# Patient Record
Sex: Male | Born: 1975 | Race: White | Hispanic: No | Marital: Married | Smoking: Never smoker
Health system: Southern US, Community
[De-identification: ages and names within clinical notes are randomized; demographics above are authoritative.]

## PROBLEM LIST (undated history)

## (undated) DIAGNOSIS — R112 Nausea with vomiting, unspecified: Secondary | ICD-10-CM

## (undated) DIAGNOSIS — R319 Hematuria, unspecified: Secondary | ICD-10-CM

## (undated) DIAGNOSIS — I10 Essential (primary) hypertension: Secondary | ICD-10-CM

## (undated) DIAGNOSIS — I517 Cardiomegaly: Secondary | ICD-10-CM

## (undated) DIAGNOSIS — J189 Pneumonia, unspecified organism: Secondary | ICD-10-CM

## (undated) DIAGNOSIS — B009 Herpesviral infection, unspecified: Secondary | ICD-10-CM

## (undated) DIAGNOSIS — Z9889 Other specified postprocedural states: Secondary | ICD-10-CM

## (undated) HISTORY — DX: Cardiomegaly: I51.7

## (undated) HISTORY — DX: Herpesviral infection, unspecified: B00.9

## (undated) HISTORY — DX: Hematuria, unspecified: R31.9

## (undated) HISTORY — PX: WISDOM TOOTH EXTRACTION: SHX21

## (undated) HISTORY — PX: KNEE ARTHROSCOPY: SUR90

---

## 1994-07-23 HISTORY — PX: ANTERIOR CRUCIATE LIGAMENT REPAIR: SHX115

## 1997-10-30 ENCOUNTER — Emergency Department (HOSPITAL_COMMUNITY): Admission: EM | Admit: 1997-10-30 | Discharge: 1997-10-30 | Payer: Self-pay | Admitting: Emergency Medicine

## 1999-01-21 ENCOUNTER — Ambulatory Visit: Admission: RE | Admit: 1999-01-21 | Discharge: 1999-01-21 | Payer: Self-pay | Admitting: General Surgery

## 2013-05-30 ENCOUNTER — Encounter: Payer: Self-pay | Admitting: Family Medicine

## 2013-06-22 ENCOUNTER — Encounter: Payer: Self-pay | Admitting: Physician Assistant

## 2013-06-22 ENCOUNTER — Ambulatory Visit (INDEPENDENT_AMBULATORY_CARE_PROVIDER_SITE_OTHER): Payer: 59 | Admitting: Physician Assistant

## 2013-06-22 VITALS — BP 130/86 | HR 60 | Temp 97.8°F | Resp 18 | Ht 69.0 in | Wt 188.0 lb

## 2013-06-22 DIAGNOSIS — Z8249 Family history of ischemic heart disease and other diseases of the circulatory system: Secondary | ICD-10-CM | POA: Insufficient documentation

## 2013-06-22 DIAGNOSIS — Z0000 Encounter for general adult medical examination without abnormal findings: Secondary | ICD-10-CM

## 2013-06-22 DIAGNOSIS — B009 Herpesviral infection, unspecified: Secondary | ICD-10-CM

## 2013-06-22 LAB — CBC WITH DIFFERENTIAL/PLATELET
BASOS ABS: 0 10*3/uL (ref 0.0–0.1)
Basophils Relative: 0 % (ref 0–1)
Eosinophils Absolute: 0.2 10*3/uL (ref 0.0–0.7)
Eosinophils Relative: 4 % (ref 0–5)
HCT: 42.2 % (ref 39.0–52.0)
Hemoglobin: 15.1 g/dL (ref 13.0–17.0)
LYMPHS ABS: 2.4 10*3/uL (ref 0.7–4.0)
Lymphocytes Relative: 39 % (ref 12–46)
MCH: 31 pg (ref 26.0–34.0)
MCHC: 35.8 g/dL (ref 30.0–36.0)
MCV: 86.7 fL (ref 78.0–100.0)
Monocytes Absolute: 0.7 10*3/uL (ref 0.1–1.0)
Monocytes Relative: 11 % (ref 3–12)
NEUTROS ABS: 2.8 10*3/uL (ref 1.7–7.7)
Neutrophils Relative %: 46 % (ref 43–77)
PLATELETS: 247 10*3/uL (ref 150–400)
RBC: 4.87 MIL/uL (ref 4.22–5.81)
RDW: 13.1 % (ref 11.5–15.5)
WBC: 6.1 10*3/uL (ref 4.0–10.5)

## 2013-06-22 LAB — COMPLETE METABOLIC PANEL WITH GFR
ALBUMIN: 4.8 g/dL (ref 3.5–5.2)
ALT: 16 U/L (ref 0–53)
AST: 19 U/L (ref 0–37)
Alkaline Phosphatase: 39 U/L (ref 39–117)
BUN: 16 mg/dL (ref 6–23)
CALCIUM: 9.5 mg/dL (ref 8.4–10.5)
CHLORIDE: 104 meq/L (ref 96–112)
CO2: 23 meq/L (ref 19–32)
Creat: 0.93 mg/dL (ref 0.50–1.35)
GFR, Est Non African American: >89 mL/min
Glucose, Bld: 83 mg/dL (ref 70–99)
POTASSIUM: 4.1 meq/L (ref 3.5–5.3)
SODIUM: 138 meq/L (ref 135–145)
TOTAL PROTEIN: 7.4 g/dL (ref 6.0–8.3)
Total Bilirubin: 0.7 mg/dL (ref 0.2–1.2)

## 2013-06-22 LAB — LIPID PANEL
Cholesterol: 219 mg/dL — ABNORMAL HIGH (ref 0–200)
HDL: 46 mg/dL (ref >39)
LDL CALC: 151 mg/dL — AB (ref 0–99)
Total CHOL/HDL Ratio: 4.8 Ratio
Triglycerides: 108 mg/dL (ref <150)
VLDL: 22 mg/dL (ref 0–40)

## 2013-06-22 MED ORDER — ACYCLOVIR 400 MG PO TABS: 400.0000 mg | ORAL_TABLET | Freq: Three times a day (TID) | ORAL | Status: DC

## 2013-06-22 NOTE — Progress Notes (Signed)
Patient ID: Kevin Paul MRN: [id number], DOB: [birth year] 38 y.o. Date of Encounter: 06/22/2013, 1:45 PM    Chief Complaint: Physical (CPE)  HPI: 38 y.o. y/o white male here for CPE.   He reports that he goes by "Kevin Paul."  He is a being seen as a new patient to our office today to establish care here.  He works as a Audiological scientist with [employer]  We did receive records from his prior PCP which was  with [facility] physicians. Records were reviewed but basically include no significant history except for a history of herpes simplex type II and only medication is acyclovir - which he uses as needed. He says that he is married and his wife also has a history of herpes so there is no reason to take suppressive therapy in terms of concern of spreading infection. He says that he rarely has a flare / episode so he sees no reason to take daily suppressive therapy in regards to this.  He has no complaints today. Says he just wanted to get a physical and lab work done given his family history of father with CAD and paternal grandfather with stroke.   Review of Systems: Consitutional: No fever, chills, fatigue, night sweats, lymphadenopathy, or weight changes. Eyes: No visual changes, eye redness, or discharge. ENT/Mouth: Ears: No otalgia, tinnitus, hearing loss, discharge. Nose: No congestion, rhinorrhea, sinus pain, or epistaxis. Throat: No sore throat, post nasal drip, or teeth pain. Cardiovascular: No CP, palpitations, diaphoresis, DOE, edema, orthopnea, PND. Respiratory: No cough, hemoptysis, SOB, or wheezing. Gastrointestinal: No anorexia, dysphagia, reflux, pain, nausea, vomiting, hematemesis, diarrhea, constipation, BRBPR, or melena. Genitourinary: No dysuria, frequency, urgency, hematuria, incontinence, nocturia, decreased urinary stream, discharge, impotence, or testicular pain/masses. Musculoskeletal: No decreased ROM, myalgias, stiffness, joint swelling, or weakness. Skin: No  rash, erythema, lesion changes, pain, warmth, jaundice, or pruritis. Neurological: No headache, dizziness, syncope, seizures, tremors, memory loss, coordination problems, or paresthesias. Psychological: No anxiety, depression, hallucinations, SI/HI. Endocrine: No fatigue, polydipsia, polyphagia, polyuria, or known diabetes. All other systems were reviewed and are otherwise negative.  Past Medical History  Diagnosis Date  . HSV-2 infection      Past Surgical History  Procedure Laterality Date  . Anterior cruciate ligament repair  07/1994  . Knee arthroscopy Bilateral     Has had multiple knee surgeries in his Teens-20s    Home Meds:  No current outpatient prescriptions on file prior to visit.   No current facility-administered medications on file prior to visit.    Allergies: No Known Allergies  History   Social History  . Marital Status: Married    Spouse Name: N/A    Number of Children: N/A  . Years of Education: N/A   Occupational History  . Not on file.   Social History Main Topics  . Smoking status: Never Smoker   . Smokeless tobacco: Never Used  . Alcohol Use: 2.4 oz/week    4 Cans of beer per week  . Drug Use: No  . Sexual Activity: Yes   Other Topics Concern  . Not on file   Social History Narrative   Paramedic for [employer]   Married. 3 children--2 are step children  1 is his own biologic child: Ages 55, 18, 46.   Last year started exercising: Runs 3 miles / day 3-4 days / week.   Also does push-ups, sit ups etc at home.    THIS  INFO  ENTERED  06/22/2013    Family  History  Problem Relation Age of Onset  . Hyperlipidemia Father   . Hypertension Father   . Heart disease Father 71    MI--stent  . Stroke Paternal Grandfather 31    Stroke    Physical Exam: Blood pressure 130/86, pulse 60, temperature 97.8 F (36.6 C), temperature source Oral, resp. rate 18, height 5' 9"  (1.753 m), weight 188 lb (85.276 kg).  General: Well developed, well  nourished, WM. Appears in no acute distress. HEENT: Normocephalic, atraumatic. Conjunctiva pink, sclera non-icteric. Pupils 2 mm constricting to 1 mm, round, regular, and equally reactive to light and accomodation. EOMI. Internal auditory canal clear. TMs with good cone of light and without pathology. Nasal mucosa pink. Nares are without discharge. No sinus tenderness. Oral mucosa pink. Dentition normal.. Pharynx without exudate.   Neck: Supple. Trachea midline. No thyromegaly. Full ROM. No lymphadenopathy. Lungs: Clear to auscultation bilaterally without wheezes, rales, or rhonchi. Breathing is of normal effort and unlabored. Cardiovascular: RRR with S1 S2. No murmurs, rubs, or gallops. Distal pulses 2+ symmetrically. No carotid or abdominal bruits. Abdomen: Soft, non-tender, non-distended with normoactive bowel sounds. No hepatosplenomegaly or masses. No rebound/guarding. No CVA tenderness. No hernias. Musculoskeletal: Full range of motion and 5/5 strength throughout. Without swelling, atrophy, tenderness, crepitus, or warmth. Extremities without clubbing, cyanosis, or edema. Calves supple. Skin: Warm and moist without erythema, ecchymosis, wounds, or rash. Neuro: A+Ox3. CN II-XII grossly intact. Moves all extremities spontaneously. Full sensation throughout. Normal gait. DTR 2+ throughout upper and lower extremities. Finger to nose intact. Psych:  Responds to questions appropriately with a normal affect.   Assessment/Plan:  38 y.o. y/o  male here for CPE -1. Visit for preventive health examination  A. Screening Labs:  He works nights. Says that when he got home from work he did eat one cookie. Had no other food or drink in the past 8 hours. Does want to go ahead and do labs. - CBC with Differential - COMPLETE METABOLIC PANEL WITH GFR - Lipid panel - Vit D  25 hydroxy (rtn osteoporosis monitoring)  B. Immunizations:  Patient says he had a tetanus vaccine in the past 2 years. Says he had the  flu vaccine in fall of 2014. He says this may have been done at [facility] urgent care or at their (for Paramedics) " training department". He says that he will get a copy of all of this and bring it to our office.  2. Family history of coronary artery disease Father with history of MI at age 78. Patient does note that his father was a smoker. Paternal grandfather with history of stroke and death in his 26s. Patient also as this grandfather was also a smoker.  3. HSV-2 infection - acyclovir (ZOVIRAX) 400 MG tablet; Take 1 tablet (400 mg total) by mouth 3 (three) times daily. For 5 days--as needed for flares.   Dispense: 15 tablet; Refill: 11  Will Call patient with results of labs. If these are normal then otherwise we'll plan for follow up in one year or sooner if needed.   Signed:   [address] [place], [place]  06/22/2013 1:45 PM

## 2013-06-23 ENCOUNTER — Telehealth: Payer: Self-pay | Admitting: Family Medicine

## 2013-06-23 DIAGNOSIS — Z79899 Other long term (current) drug therapy: Secondary | ICD-10-CM

## 2013-06-23 DIAGNOSIS — E785 Hyperlipidemia, unspecified: Secondary | ICD-10-CM

## 2013-06-23 LAB — VITAMIN D 25 HYDROXY (VIT D DEFICIENCY, FRACTURES): VIT D 25 HYDROXY: 39 ng/mL (ref 30–89)

## 2013-06-23 MED ORDER — SIMVASTATIN 10 MG PO TABS: 10.0000 mg | ORAL_TABLET | Freq: Every day | ORAL | Status: DC

## 2013-06-23 NOTE — Telephone Encounter (Signed)
Message copied by Olena Mater on Thu Jun 23, 2013  3:46 PM ------      Message from: Dena Billet      Created: Thu Jun 23, 2013  7:15 AM       This patient was seen as a new patient yesterday for physical. 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      His father had MI and stent at age 46. Paternal grandfather had strokes at age 42.      Tell him that if he wants to schedule a followup office visit to discuss cholesterol numbers and risk factors further, that he can certainly schedule visit.      Otherwise, I would recommend to start simvastatin 10 mg one by mouth each bedtime #30 with one refill.  Place future or for FLP/LFT. Tell patient to come fasting for this in 6 weeks.      Tell him that all other labs are completely normal. ------

## 2013-06-23 NOTE — Telephone Encounter (Signed)
Pt called about lab results. Aware need to start simvastatin.  Repeat fasting labs in 6 weeks.  Rx to pharmacy and labs ordered

## 2013-07-02 ENCOUNTER — Emergency Department (HOSPITAL_COMMUNITY)
Admission: EM | Admit: 2013-07-02 | Discharge: 2013-07-02 | Disposition: A | Discharge status: Home / Self Care | Payer: Worker's Compensation | Attending: Emergency Medicine | Admitting: Emergency Medicine

## 2013-07-02 ENCOUNTER — Emergency Department (HOSPITAL_COMMUNITY): Payer: Worker's Compensation

## 2013-07-02 ENCOUNTER — Encounter (HOSPITAL_COMMUNITY): Payer: Self-pay | Admitting: Emergency Medicine

## 2013-07-02 DIAGNOSIS — IMO0002 Reserved for concepts with insufficient information to code with codable children: Secondary | ICD-10-CM | POA: Diagnosis not present

## 2013-07-02 DIAGNOSIS — S6000XA Contusion of unspecified finger without damage to nail, initial encounter: Secondary | ICD-10-CM | POA: Insufficient documentation

## 2013-07-02 DIAGNOSIS — Z79899 Other long term (current) drug therapy: Secondary | ICD-10-CM | POA: Diagnosis not present

## 2013-07-02 DIAGNOSIS — Y990 Civilian activity done for income or pay: Secondary | ICD-10-CM | POA: Insufficient documentation

## 2013-07-02 DIAGNOSIS — Y939 Activity, unspecified: Secondary | ICD-10-CM | POA: Insufficient documentation

## 2013-07-02 DIAGNOSIS — S60112A Contusion of left thumb with damage to nail, initial encounter: Secondary | ICD-10-CM

## 2013-07-02 DIAGNOSIS — Z8619 Personal history of other infectious and parasitic diseases: Secondary | ICD-10-CM | POA: Insufficient documentation

## 2013-07-02 DIAGNOSIS — S6990XA Unspecified injury of unspecified wrist, hand and finger(s), initial encounter: Secondary | ICD-10-CM | POA: Diagnosis present

## 2013-07-02 DIAGNOSIS — S6980XA Other specified injuries of unspecified wrist, hand and finger(s), initial encounter: Secondary | ICD-10-CM | POA: Diagnosis present

## 2013-07-02 DIAGNOSIS — Y929 Unspecified place or not applicable: Secondary | ICD-10-CM | POA: Insufficient documentation

## 2013-07-02 NOTE — Discharge Instructions (Signed)
Subungual Hematoma A subungual hematoma is a pocket of blood that collects under the fingernail or toenail. The pressure created by the blood under the nail can cause pain. CAUSES  A subungual hematoma occurs when an injury to the finger or toe causes a blood vessel beneath the nail to break. The injury can occur from a direct blow such as slamming a finger in a door. It can also occur from a repeated injury such as pressure on the foot in a shoe while running. A subungual hematoma is sometimes called runner's toe or tennis toe. SYMPTOMS   Blue or dark blue skin under the nail.  Pain or throbbing in the injured area. DIAGNOSIS  Your caregiver can determine whether you have a subungual hematoma based on your history and a physical exam. If your caregiver thinks you might have a broken (fractured) bone, X-rays may be taken. TREATMENT  Hematomas usually go away on their own over time. Your caregiver may make a hole in the nail to drain the blood. Draining the blood is painless and usually provides significant relief from pain and throbbing. The nail usually grows back normally after this procedure. In some cases, the nail may need to be removed. This is done if there is a cut under the nail that requires stitches (sutures). HOME CARE INSTRUCTIONS   Put ice on the injured area.  Put ice in a plastic bag.  Place a towel between your skin and the bag.  Leave the ice on for 15-20 minutes, 03-04 times a day for the first 1 to 2 days.  Elevate the injured area to help decrease pain and swelling.  If you were given a bandage, wear it for as long as directed by your caregiver.  If part of your nail falls off, trim the remaining nail gently. This prevents the nail from catching on something and causing further injury.  Only take over-the-counter or prescription medicines for pain, discomfort, or fever as directed by your caregiver. SEEK IMMEDIATE MEDICAL CARE IF:   You have redness or swelling  around the nail.  You have yellowish-white fluid (pus) coming from the nail.  Your pain is not controlled with medicine.  You have a fever. MAKE SURE YOU:  Understand these instructions.  Will watch your condition.  Will get help right away if you are not doing well or get worse. Document Released: 03/07/2000 Document Revised: 06/02/2011 Document Reviewed: 02/26/2011 [facility] Patient Information 2014 [place], [place].

## 2013-07-02 NOTE — ED Provider Notes (Signed)
CSN: [id number]     Arrival date & time 07/02/13  0305 History   First MD Initiated Contact with Patient 07/02/13 0356     Chief Complaint  Patient presents with  . Finger Injury     (Consider location/radiation/quality/duration/timing/severity/associated sxs/prior Treatment) HPI 38 year old male, local ENT, presents to the emergency department with complaint of left thumb pain.  Patient slammed his left thumb into a bar around midnight.  Since that time, he has noted bruising under the nail.  He is concerned for fracture.  He reports a throbbing pain to the film.  Patient is right-hand dominant. Past Medical History  Diagnosis Date  . HSV-2 infection    Past Surgical History  Procedure Laterality Date  . Anterior cruciate ligament repair  07/1994  . Knee arthroscopy Bilateral     Has had multiple knee surgeries in his Teens-20s   Family History  Problem Relation Age of Onset  . Hyperlipidemia Father   . Hypertension Father   . Heart disease Father 58    MI--stent  . Stroke Paternal Grandfather 25    Stroke   History  Substance Use Topics  . Smoking status: Never Smoker   . Smokeless tobacco: Never Used  . Alcohol Use: 2.4 oz/week    4 Cans of beer per week    Review of Systems  See History of Present Illness; otherwise all other systems are reviewed and negative   Allergies  Review of patient's allergies indicates no known allergies.  Home Medications   Current Outpatient Rx  Name  Route  Sig  Dispense  Refill  . acyclovir (ZOVIRAX) 400 MG tablet   Oral   Take 1 tablet (400 mg total) by mouth 3 (three) times daily. For 5 days   15 tablet   11   . simvastatin (ZOCOR) 10 MG tablet   Oral   Take 1 tablet (10 mg total) by mouth at bedtime.   30 tablet   1    BP 126/78  Pulse 59  Temp(Src) 97 F (36.1 C) (Oral)  Resp 20  SpO2 98% Physical Exam  Musculoskeletal: Normal range of motion. He exhibits tenderness.  Patient with some clinical hematoma under  left thumb.  Refill is normal.  Sensation is normal.  He has mild abrasion to the medial aspect of the nail without deep laceration    ED Course  Procedures (including critical care time) Labs Review Labs Reviewed - No data to display Imaging Review Dg Finger Thumb Left  07/02/2013   CLINICAL DATA:  Left thumb pain after injury.  EXAM: LEFT THUMB 2+V  COMPARISON:  None.  FINDINGS: There is no evidence of fracture or dislocation. There is no evidence of arthropathy or other focal bone abnormality. Soft tissues are unremarkable  IMPRESSION: Negative.   Electronically Signed   By: Lucienne Capers M.D.   On: 07/02/2013 04:04     EKG Interpretation None      MDM   Final diagnoses:  Subungual hematoma of left thumb    38 yo male with contusion to left thumb, subungal hematoma,  No fracture.  Slight improvement with trephination     Kalman Drape, MD 07/04/13 [phone]

## 2013-07-02 NOTE — ED Notes (Signed)
Patient slammed left thumb into metal bar around midnight tonight while at work.  Patient states left thumb with pain and swelling, no deformity.

## 2013-07-20 ENCOUNTER — Encounter: Payer: Self-pay | Admitting: Family Medicine

## 2013-07-20 ENCOUNTER — Ambulatory Visit (INDEPENDENT_AMBULATORY_CARE_PROVIDER_SITE_OTHER): Payer: 59 | Admitting: Family Medicine

## 2013-07-20 VITALS — BP 124/78 | HR 76 | Temp 97.9°F | Resp 16 | Ht 69.0 in | Wt 189.0 lb

## 2013-07-20 DIAGNOSIS — J069 Acute upper respiratory infection, unspecified: Secondary | ICD-10-CM

## 2013-07-20 MED ORDER — AZITHROMYCIN 250 MG PO TABS: ORAL_TABLET | ORAL | Status: DC

## 2013-07-20 NOTE — Progress Notes (Signed)
Patient ID: Kevin Paul, male   DOB: 08/29/1975, 38 y.o.   MRN: [id number]   Subjective:    Patient ID: Kevin Paul, male    DOB: Jan 13, 1976, 38 y.o.   MRN: [id number]  Patient presents for Sinus infection  Patient here with sinus pressure drainage cough subjective fever and chills for the past 3 days. He works as an Public relations account executive. He has no chronic lung disease or history of chronic sinusitis. He has taken some over-the-counter ibuprofen for fever. His cough has a mild production. He's not had any shortness of breath.   Review Of Systems:  GEN- denies fatigue,+ fever, weight loss,weakness, recent illness HEENT- denies eye drainage, change in vision, +nasal discharge, CVS- denies chest pain, palpitations RESP- denies SOB, +cough, wheeze Neuro- denies headache, dizziness, syncope, seizure activity       Objective:    BP 124/78  Pulse 76  Temp(Src) 97.9 F (36.6 C) (Oral)  Resp 16  Ht 5' 9"  (1.753 m)  Wt 189 lb (85.73 kg)  BMI 27.90 kg/m2 GEN- NAD, alert and oriented x3 HEENT- PERRL, EOMI, non injected sclera, pink conjunctiva, MMM, oropharynx mild injection, TM clear bilat no effusion, no maxillary sinus tenderness,+ Nasal drainage  Neck- Supple, shotty LAD CVS- RRR, no murmur RESP-CTAB EXT- No edema Pulses- Radial 2+          Assessment & Plan:      Problem List Items Addressed This Visit   None    Visit Diagnoses   Acute URI    -  Primary    Relevant Medications       azithromycin (ZITHROMAX) tablet       Note: This dictation was prepared with Dragon dictation along with smaller phrase technology. Any transcriptional errors that result from this process are unintentional.

## 2013-07-20 NOTE — Patient Instructions (Signed)
Take mucinex DM Start zpak if not better Note for work F/U as needed

## 2013-08-11 ENCOUNTER — Other Ambulatory Visit: Payer: 59

## 2013-08-11 DIAGNOSIS — E785 Hyperlipidemia, unspecified: Secondary | ICD-10-CM

## 2013-08-11 DIAGNOSIS — Z79899 Other long term (current) drug therapy: Secondary | ICD-10-CM

## 2013-08-11 LAB — HEPATIC FUNCTION PANEL
ALBUMIN: 4.7 g/dL (ref 3.5–5.2)
ALT: 18 U/L (ref 0–53)
AST: 20 U/L (ref 0–37)
Alkaline Phosphatase: 42 U/L (ref 39–117)
Bilirubin, Direct: 0.2 mg/dL (ref 0.0–0.3)
Indirect Bilirubin: 0.7 mg/dL (ref 0.2–1.2)
TOTAL PROTEIN: 7.4 g/dL (ref 6.0–8.3)
Total Bilirubin: 0.9 mg/dL (ref 0.2–1.2)

## 2013-08-11 LAB — LIPID PANEL
CHOLESTEROL: 164 mg/dL (ref 0–200)
HDL: 44 mg/dL (ref >39)
LDL Cholesterol: 100 mg/dL — ABNORMAL HIGH (ref 0–99)
Total CHOL/HDL Ratio: 3.7 Ratio
Triglycerides: 101 mg/dL (ref <150)
VLDL: 20 mg/dL (ref 0–40)

## 2013-08-12 ENCOUNTER — Telehealth: Payer: Self-pay | Admitting: Family Medicine

## 2013-08-12 DIAGNOSIS — E785 Hyperlipidemia, unspecified: Secondary | ICD-10-CM

## 2013-08-12 MED ORDER — SIMVASTATIN 10 MG PO TABS: 10.0000 mg | ORAL_TABLET | Freq: Every day | ORAL | Status: DC

## 2013-08-12 NOTE — Telephone Encounter (Signed)
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      Tell him that his LFTs are normal.      Tell him his LDL went from 151 to 100--GREAT!!      Tell him that he will need an office visit and fasting labs in 6 months. Schedule office visit and come fasting to the appointment. ------

## 2013-08-12 NOTE — Telephone Encounter (Signed)
Pt aware of lab results.  Reinforce to keep up good work.  Continue same medications.  Simvastatin refilled for 6 months.  Will call back to schedule 6 month visit

## 2013-08-24 ENCOUNTER — Other Ambulatory Visit: Payer: Self-pay | Admitting: Physician Assistant

## 2014-01-09 ENCOUNTER — Telehealth: Payer: Self-pay | Admitting: Family Medicine

## 2014-01-09 MED ORDER — SIMVASTATIN 10 MG PO TABS: 10.0000 mg | ORAL_TABLET | Freq: Every day | ORAL | Status: DC

## 2014-01-09 NOTE — Telephone Encounter (Signed)
Medication refilled per protocol. 

## 2014-01-16 ENCOUNTER — Telehealth: Payer: Self-pay | Admitting: Physician Assistant

## 2014-01-16 MED ORDER — SIMVASTATIN 10 MG PO TABS: 10.0000 mg | ORAL_TABLET | Freq: Every day | ORAL | Status: DC

## 2014-01-16 NOTE — Telephone Encounter (Signed)
Patient has appt with dr Buelah Manis next month but is going to need simvastatin before than  [facility]  Best number to call him is [phone]

## 2014-01-16 NOTE — Telephone Encounter (Signed)
Medication filled x1 with no refills.

## 2014-01-17 ENCOUNTER — Other Ambulatory Visit: Payer: Self-pay | Admitting: Physician Assistant

## 2014-01-17 NOTE — Telephone Encounter (Signed)
Refill denied, just Rf 10/19

## 2014-01-30 ENCOUNTER — Encounter: Payer: Self-pay | Admitting: Family Medicine

## 2014-01-30 ENCOUNTER — Ambulatory Visit (INDEPENDENT_AMBULATORY_CARE_PROVIDER_SITE_OTHER): Payer: 59 | Admitting: Family Medicine

## 2014-01-30 VITALS — BP 128/70 | HR 86 | Temp 97.8°F | Resp 16 | Ht 69.0 in | Wt 190.0 lb

## 2014-01-30 DIAGNOSIS — E785 Hyperlipidemia, unspecified: Secondary | ICD-10-CM

## 2014-01-30 NOTE — Patient Instructions (Signed)
Release Of records- [facility]  Return for fasting labs  F/U April 2016 for physical

## 2014-01-30 NOTE — Progress Notes (Signed)
Patient ID: Kevin Paul, male   DOB: [birth year], 38 y.o.   MRN: [id number]   Subjective:    Patient ID: Kevin Paul, male    DOB: Sep 10, 1975, 38 y.o.   MRN: [id number]  Patient presents for Medication Review and BioMedical Screening patient here to follow-up cholesterol medication. He is on simvastatin 10 mg. He is no specific concerns. He tries to watch his diet is working on a regular basis. He does have a strong family history of heart disease. He is also due for his biomedical screening he has a form that will be faxed to our office when he comes in.    Review Of Systems:  GEN- denies fatigue, fever, weight loss,weakness, recent illness HEENT- denies eye drainage, change in vision, nasal discharge, CVS- denies chest pain, palpitations RESP- denies SOB, cough, wheeze ABD- denies N/V, change in stools, abd pain GU- denies dysuria, hematuria, dribbling, incontinence MSK- denies joint pain, muscle aches, injury Neuro- denies headache, dizziness, syncope, seizure activity       Objective:    BP 128/70 mmHg  Pulse 86  Temp(Src) 97.8 F (36.6 C) (Oral)  Resp 16  Ht 5' 9"  (1.753 m)  Wt 190 lb (86.183 kg)  BMI 28.05 kg/m2 GEN- NAD, alert and oriented x3 CVS- RRR, no murmur RESP-CTAB EXT- No edema Pulses- Radial 2+        Assessment & Plan:      Problem List Items Addressed This Visit    None      Note: This dictation was prepared with Dragon dictation along with smaller phrase technology. Any transcriptional errors that result from this process are unintentional.

## 2014-01-30 NOTE — Assessment & Plan Note (Signed)
Hyperlipidemia currently on treatment with simvastatin 10 mg drawn family history of coronary artery disease. I'll recheck his fasting labs as well as metabolic panel and glucose for his biomedical screening

## 2014-02-01 ENCOUNTER — Other Ambulatory Visit: Payer: 59

## 2014-02-01 LAB — COMPREHENSIVE METABOLIC PANEL
ALT: 16 U/L (ref 0–53)
AST: 15 U/L (ref 0–37)
Albumin: 4.4 g/dL (ref 3.5–5.2)
Alkaline Phosphatase: 35 U/L — ABNORMAL LOW (ref 39–117)
BUN: 17 mg/dL (ref 6–23)
CHLORIDE: 103 meq/L (ref 96–112)
CO2: 26 mEq/L (ref 19–32)
Calcium: 9.4 mg/dL (ref 8.4–10.5)
Creat: 0.95 mg/dL (ref 0.50–1.35)
Glucose, Bld: 93 mg/dL (ref 70–99)
Potassium: 4.6 mEq/L (ref 3.5–5.3)
Sodium: 138 mEq/L (ref 135–145)
Total Bilirubin: 0.5 mg/dL (ref 0.2–1.2)
Total Protein: 7 g/dL (ref 6.0–8.3)

## 2014-02-01 LAB — LIPID PANEL
CHOL/HDL RATIO: 3.3 ratio
Cholesterol: 167 mg/dL (ref 0–200)
HDL: 50 mg/dL (ref >39)
LDL Cholesterol: 98 mg/dL (ref 0–99)
Triglycerides: 94 mg/dL (ref <150)
VLDL: 19 mg/dL (ref 0–40)

## 2014-02-01 LAB — CBC WITH DIFFERENTIAL/PLATELET
Basophils Absolute: 0 10*3/uL (ref 0.0–0.1)
Basophils Relative: 0 % (ref 0–1)
Eosinophils Absolute: 0.4 10*3/uL (ref 0.0–0.7)
Eosinophils Relative: 6 % — ABNORMAL HIGH (ref 0–5)
HEMATOCRIT: 43.8 % (ref 39.0–52.0)
HEMOGLOBIN: 15.5 g/dL (ref 13.0–17.0)
LYMPHS PCT: 37 % (ref 12–46)
Lymphs Abs: 2.3 10*3/uL (ref 0.7–4.0)
MCH: 31.3 pg (ref 26.0–34.0)
MCHC: 35.4 g/dL (ref 30.0–36.0)
MCV: 88.3 fL (ref 78.0–100.0)
MONO ABS: 0.6 10*3/uL (ref 0.1–1.0)
MONOS PCT: 10 % (ref 3–12)
Neutro Abs: 3 10*3/uL (ref 1.7–7.7)
Neutrophils Relative %: 47 % (ref 43–77)
PLATELETS: 241 10*3/uL (ref 150–400)
RBC: 4.96 MIL/uL (ref 4.22–5.81)
RDW: 13.2 % (ref 11.5–15.5)
WBC: 6.3 10*3/uL (ref 4.0–10.5)

## 2014-02-02 ENCOUNTER — Encounter: Payer: Self-pay | Admitting: *Deleted

## 2014-02-13 ENCOUNTER — Telehealth: Payer: Self-pay | Admitting: Physician Assistant

## 2014-02-23 ENCOUNTER — Encounter: Payer: Self-pay | Admitting: Family Medicine

## 2014-02-23 ENCOUNTER — Ambulatory Visit (INDEPENDENT_AMBULATORY_CARE_PROVIDER_SITE_OTHER): Payer: 59 | Admitting: Physician Assistant

## 2014-02-23 ENCOUNTER — Encounter: Payer: Self-pay | Admitting: Physician Assistant

## 2014-02-23 VITALS — BP 126/86 | HR 60 | Temp 97.7°F | Resp 18 | Wt 192.0 lb

## 2014-02-23 DIAGNOSIS — J029 Acute pharyngitis, unspecified: Secondary | ICD-10-CM

## 2014-02-23 DIAGNOSIS — R52 Pain, unspecified: Secondary | ICD-10-CM

## 2014-02-23 LAB — INFLUENZA A AND B
INFLUENZA B AG: NEGATIVE
Inflenza A Ag: NEGATIVE

## 2014-02-23 LAB — RAPID STREP SCREEN (MED CTR MEBANE ONLY): STREPTOCOCCUS, GROUP A SCREEN (DIRECT): NEGATIVE

## 2014-02-23 NOTE — Progress Notes (Signed)
    Patient ID: JSHON IBE MRN: [id number], DOB: [birth year], 38 y.o. Date of Encounter: 02/23/2014, 11:02 AM    Chief Complaint:  Chief Complaint  Patient presents with  . sore throat, body aches    wife had flu last week,  also c/o neck pain     HPI: 38 y.o. year old white male says his symptoms just started yesterday morning. Yesterday morning had a sore throat. Today he has developed some body aches. Also today has felt a little bit of congestion in his nasal/sinus area. Has been blowing no mucus from his nose. Has had no cough or chest congestion. Has had no fevers or chills.  Says that last week his wife had similar symptoms and went to the urgent care and was told that she had the flu.  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     Home Meds:   Outpatient Prescriptions Prior to Visit  Medication Sig Dispense Refill  . simvastatin (ZOCOR) 10 MG tablet Take 1 tablet (10 mg total) by mouth at bedtime. 90 tablet 0  . acyclovir (ZOVIRAX) 400 MG tablet Take 1 tablet (400 mg total) by mouth 3 (three) times daily. For 5 days (Patient not taking: Reported on 02/23/2014) 15 tablet 11   No facility-administered medications prior to visit.    Allergies: No Known Allergies    Review of Systems: See HPI for pertinent ROS. All other ROS negative.    Physical Exam: Blood pressure 126/86, pulse 60, temperature 97.7 F (36.5 C), temperature source Oral, resp. rate 18, weight 192 lb (87.091 kg)., Body mass index is 28.34 kg/(m^2). General:  WNWD WM. Does not appear significantly ill. Appears in no acute distress. HEENT: Normocephalic, atraumatic, eyes without discharge, sclera non-icteric, nares are without discharge. Bilateral auditory canals clear, TM's are without perforation, pearly grey and translucent with reflective cone of  light bilaterally. Oral cavity moist, posterior pharynx without exudate, erythema, peritonsillar abscess. Ears no significant erythema with the tonsils and pharynx. No exudate.  Neck: Supple. No thyromegaly. No lymphadenopathy. Lungs: Clear bilaterally to auscultation without wheezes, rales, or rhonchi. Breathing is unlabored. Heart: Regular rhythm. No murmurs, rubs, or gallops. Msk:  Strength and tone normal for age. Extremities/Skin: Warm and dry.  No rashes. Neuro: Alert and oriented X 3. Moves all extremities spontaneously. Gait is normal. CNII-XII grossly in tact. Psych:  Responds to questions appropriately with a normal affect.    Rapid strep test and influenza test are negative.   ASSESSMENT AND PLAN:  38 y.o. year old male with  1. Viral pharyngitis Rapid strep test and influenza test are negative. His history and exam findings are consistent with a viral illness. Treat with over-the-counter lozenges and spray Tylenol and Motrin as needed for symptom relief. Follow-up with [place] if develops fever or if symptoms worsen significantly or if symptoms are not resolving in one week. We have given him a letter to be out of work through Sunday 02/26/2014.  2. Body aches - Influenza a and b - Rapid Strep Screen  3. Sorethroat - Influenza a and b - Rapid Strep Screen   Signed, [address] [place], [place], [facility] 02/23/2014 11:02 AM

## 2014-05-05 ENCOUNTER — Other Ambulatory Visit: Payer: 59

## 2014-08-14 ENCOUNTER — Ambulatory Visit (INDEPENDENT_AMBULATORY_CARE_PROVIDER_SITE_OTHER): Payer: 59 | Admitting: Physician Assistant

## 2014-08-14 ENCOUNTER — Encounter: Payer: Self-pay | Admitting: Physician Assistant

## 2014-08-14 VITALS — BP 110/80 | HR 68 | Temp 98.1°F | Resp 19 | Wt 195.0 lb

## 2014-08-14 DIAGNOSIS — R319 Hematuria, unspecified: Secondary | ICD-10-CM | POA: Diagnosis not present

## 2014-08-14 DIAGNOSIS — E785 Hyperlipidemia, unspecified: Secondary | ICD-10-CM

## 2014-08-14 DIAGNOSIS — Z8249 Family history of ischemic heart disease and other diseases of the circulatory system: Secondary | ICD-10-CM

## 2014-08-14 LAB — CBC WITH DIFFERENTIAL/PLATELET
BASOS ABS: 0 10*3/uL (ref 0.0–0.1)
BASOS PCT: 0 % (ref 0–1)
Eosinophils Absolute: 0.2 10*3/uL (ref 0.0–0.7)
Eosinophils Relative: 4 % (ref 0–5)
HCT: 43.8 % (ref 39.0–52.0)
HEMOGLOBIN: 15.6 g/dL (ref 13.0–17.0)
Lymphocytes Relative: 41 % (ref 12–46)
Lymphs Abs: 2.4 10*3/uL (ref 0.7–4.0)
MCH: 31 pg (ref 26.0–34.0)
MCHC: 35.6 g/dL (ref 30.0–36.0)
MCV: 86.9 fL (ref 78.0–100.0)
MONO ABS: 0.5 10*3/uL (ref 0.1–1.0)
MPV: 9.8 fL (ref 8.6–12.4)
Monocytes Relative: 8 % (ref 3–12)
Neutro Abs: 2.7 10*3/uL (ref 1.7–7.7)
Neutrophils Relative %: 47 % (ref 43–77)
PLATELETS: 254 10*3/uL (ref 150–400)
RBC: 5.04 MIL/uL (ref 4.22–5.81)
RDW: 13.4 % (ref 11.5–15.5)
WBC: 5.8 10*3/uL (ref 4.0–10.5)

## 2014-08-14 LAB — URINALYSIS, ROUTINE W REFLEX MICROSCOPIC
Bilirubin Urine: NEGATIVE
Glucose, UA: NEGATIVE mg/dL
KETONES UR: NEGATIVE mg/dL
Leukocytes, UA: NEGATIVE
Nitrite: NEGATIVE
PH: 6.5 (ref 5.0–8.0)
PROTEIN: NEGATIVE mg/dL
Specific Gravity, Urine: 1.01 (ref 1.005–1.030)
Urobilinogen, UA: 0.2 mg/dL (ref 0.0–1.0)

## 2014-08-14 LAB — URINALYSIS, MICROSCOPIC ONLY
CRYSTALS: NONE SEEN
Casts: NONE SEEN

## 2014-08-14 LAB — COMPLETE METABOLIC PANEL WITH GFR
ALK PHOS: 32 U/L — AB (ref 39–117)
ALT: 27 U/L (ref 0–53)
AST: 21 U/L (ref 0–37)
Albumin: 4.8 g/dL (ref 3.5–5.2)
BILIRUBIN TOTAL: 0.7 mg/dL (ref 0.2–1.2)
BUN: 18 mg/dL (ref 6–23)
CO2: 27 mEq/L (ref 19–32)
Calcium: 9.5 mg/dL (ref 8.4–10.5)
Chloride: 104 mEq/L (ref 96–112)
Creat: 0.94 mg/dL (ref 0.50–1.35)
GLUCOSE: 94 mg/dL (ref 70–99)
Potassium: 4 mEq/L (ref 3.5–5.3)
SODIUM: 139 meq/L (ref 135–145)
TOTAL PROTEIN: 7.3 g/dL (ref 6.0–8.3)

## 2014-08-14 LAB — LIPID PANEL
CHOLESTEROL: 162 mg/dL (ref 0–200)
HDL: 47 mg/dL (ref >=40)
LDL Cholesterol: 100 mg/dL — ABNORMAL HIGH (ref 0–99)
TRIGLYCERIDES: 76 mg/dL (ref <150)
Total CHOL/HDL Ratio: 3.4 Ratio
VLDL: 15 mg/dL (ref 0–40)

## 2014-08-14 NOTE — Addendum Note (Signed)
Addended by: Haskel Khan A on: 08/14/2014 11:20 AM   Modules accepted: Orders

## 2014-08-14 NOTE — Progress Notes (Signed)
Patient ID: Kevin Paul MRN: [id number], DOB: November 26, 1975, 39 y.o. Date of Encounter: @DATE @  Chief Complaint:  Chief Complaint  Patient presents with  . Blood in urine    Last night    HPI: 39 y.o. year old white male  presents with symptoms as below.  He works as a Therapist, occupational.  He says that he has been feeling just fine. Says that yesterday he went running and was feeling fine. Says that he had a little twinge of discomfort like he needed to urinate. Went home and actually had an episode of diarrhea. Then last night when he urinated everything was normal. He was feeling no dysuria no burning no pain of any sort. Urine itself was a normal color. However a clot of blood came out. He uses his fingers to show me the size and it according to this,  it was at least 1 cm in size.  Says this morning again he urinated and the urine looked normal and he was having no dysuria. Saw a smaller size blood clot in the urine this morning.  Has had no pain and his back or along the flank or the low abdomen to suggest kidney stone or other trauma/injury to these areas. He has had no trauma or injury to the pelvic area or back or flank. Also says that he and his wife have not had sex recently that he does not think this would be causing any trauma etc. to cause the blood clot.  He has no history of any type of clots like this and no urology history.   Past Medical History  Diagnosis Date  . HSV-2 infection      Home Meds: Outpatient Prescriptions Prior to Visit  Medication Sig Dispense Refill  . acyclovir (ZOVIRAX) 400 MG tablet Take 1 tablet (400 mg total) by mouth 3 (three) times daily. For 5 days 15 tablet 11  . simvastatin (ZOCOR) 10 MG tablet Take 1 tablet (10 mg total) by mouth at bedtime. 90 tablet 0   No facility-administered medications prior to visit.    Allergies: No Known Allergies  History   Social History  . Marital Status: Married    Spouse Name: N/A  . Number  of Children: N/A  . Years of Education: N/A   Occupational History  . Not on file.   Social History Main Topics  . Smoking status: Never Smoker   . Smokeless tobacco: Never Used  . Alcohol Use: 2.4 oz/week    4 Cans of beer per week  . Drug Use: No  . Sexual Activity: Yes   Other Topics Concern  . Not on file   Social History Narrative   Paramedic for [employer]   Married. 3 children--2 are step children  1 is his own biologic child: Ages 32, 100, 22.   Last year started exercising: Runs 3 miles / day 3-4 days / week.   Also does push-ups, sit ups etc at home.    THIS  INFO  ENTERED  06/22/2013    Family History  Problem Relation Age of Onset  . Hyperlipidemia Father   . Hypertension Father   . Heart disease Father 53    MI--stent  . Stroke Paternal Grandfather 50    Stroke     Review of Systems:  See HPI for pertinent ROS. All other ROS negative.    Physical Exam: Blood pressure 110/80, pulse 68, temperature 98.1 F (36.7 C), temperature source Oral, resp. rate 19,  weight 195 lb (88.451 kg)., Body mass index is 28.78 kg/(m^2). General: WNWD WM. Appears in no acute distress. Neck: Supple. No thyromegaly. No lymphadenopathy. Lungs: Clear bilaterally to auscultation without wheezes, rales, or rhonchi. Breathing is unlabored. Heart: RRR with S1 S2. No murmurs, rubs, or gallops. Abdomen: Soft, non-tender, non-distended with normoactive bowel sounds. No hepatomegaly. No rebound/guarding. No obvious abdominal masses. Musculoskeletal:  Strength and tone normal for age. No costophrenic angle tenderness. No tenderness along the flanks. Extremities/Skin: Warm and dry.  Neuro: Alert and oriented X 3. Moves all extremities spontaneously. Gait is normal. CNII-XII grossly in tact. Psych:  Responds to questions appropriately with a normal affect.     ASSESSMENT AND PLAN:  39 y.o. year old male with  1. Blood in urine - Urinalysis, Routine w reflex microscopic - COMPLETE  METABOLIC PANEL WITH GFR - CBC with Differential/Platelet - Ambulatory referral to Urology  2. Family history of coronary artery disease  3. Hyperlipidemia Patient states that since were doing blood work anyway, would like to go ahead and recheck his labs for his lipids. Labs are due as last set was performed in November. He is on simvastatin. He is fasting. - COMPLETE METABOLIC PANEL WITH GFR - Lipid panel   Signed, [address] [place], [place], [facility] 08/14/2014 11:06 AM

## 2014-08-15 ENCOUNTER — Telehealth: Payer: Self-pay | Admitting: *Deleted

## 2014-08-15 LAB — URINE CULTURE
COLONY COUNT: NO GROWTH
ORGANISM ID, BACTERIA: NO GROWTH

## 2014-08-15 NOTE — Telephone Encounter (Signed)
pt has appt scheduled for Thurs 08/17/14 at 12:15pm wiht Dr. Nicolette Bang at [facility], [facility] to pt for appt informatin

## 2014-08-15 NOTE — Telephone Encounter (Signed)
Pt called back and aware of appt

## 2014-08-24 ENCOUNTER — Telehealth: Payer: Self-pay | Admitting: Internal Medicine

## 2014-08-24 DIAGNOSIS — I517 Cardiomegaly: Secondary | ICD-10-CM

## 2014-08-24 DIAGNOSIS — R319 Hematuria, unspecified: Secondary | ICD-10-CM

## 2014-08-24 HISTORY — DX: Cardiomegaly: I51.7

## 2014-08-24 HISTORY — DX: Hematuria, unspecified: R31.9

## 2014-08-24 NOTE — Telephone Encounter (Signed)
Received records from [facility] for appointment with Dr Debara Pickett on 10/20/14.  Records given to [facility] (medical records) for Dr Lysbeth Penner schedule on 10/20/14. lp

## 2014-08-25 ENCOUNTER — Other Ambulatory Visit: Payer: Self-pay | Admitting: Physician Assistant

## 2014-08-25 NOTE — Telephone Encounter (Signed)
Refill appropriate and filled per protocol. 

## 2014-08-30 ENCOUNTER — Encounter: Payer: Self-pay | Admitting: *Deleted

## 2014-10-20 ENCOUNTER — Ambulatory Visit: Payer: Self-pay | Admitting: Internal Medicine

## 2014-12-07 ENCOUNTER — Other Ambulatory Visit: Payer: Self-pay | Admitting: Physician Assistant

## 2014-12-07 NOTE — Telephone Encounter (Signed)
Medication refilled per protocol. 

## 2015-03-07 ENCOUNTER — Other Ambulatory Visit: Payer: Self-pay | Admitting: Family Medicine

## 2015-03-07 ENCOUNTER — Encounter: Payer: Self-pay | Admitting: Family Medicine

## 2015-03-07 MED ORDER — SIMVASTATIN 10 MG PO TABS: 10.0000 mg | ORAL_TABLET | Freq: Every day | ORAL | Status: DC

## 2015-03-07 NOTE — Telephone Encounter (Signed)
Medication refill for one time only.  Patient needs to be seen.  Letter sent for patient to call and schedule 

## 2015-07-23 ENCOUNTER — Encounter: Payer: Self-pay | Admitting: Family Medicine

## 2015-07-23 ENCOUNTER — Other Ambulatory Visit: Payer: Self-pay | Admitting: Physician Assistant

## 2015-07-23 NOTE — Telephone Encounter (Signed)
Medication refill for one time only.  Patient needs to be seen.  Letter sent for patient to call and schedule 

## 2015-08-06 ENCOUNTER — Encounter: Payer: Self-pay | Admitting: Physician Assistant

## 2015-08-06 ENCOUNTER — Ambulatory Visit (INDEPENDENT_AMBULATORY_CARE_PROVIDER_SITE_OTHER): Payer: 59 | Admitting: Physician Assistant

## 2015-08-06 VITALS — BP 120/84 | HR 68 | Temp 98.0°F | Resp 18 | Wt 200.0 lb

## 2015-08-06 DIAGNOSIS — E785 Hyperlipidemia, unspecified: Secondary | ICD-10-CM

## 2015-08-06 DIAGNOSIS — Z8249 Family history of ischemic heart disease and other diseases of the circulatory system: Secondary | ICD-10-CM | POA: Diagnosis not present

## 2015-08-06 LAB — LIPID PANEL
CHOL/HDL RATIO: 3.5 ratio (ref <=5.0)
Cholesterol: 178 mg/dL (ref 125–200)
HDL: 51 mg/dL (ref >=40)
LDL CALC: 110 mg/dL (ref <130)
Triglycerides: 83 mg/dL (ref <150)
VLDL: 17 mg/dL (ref <30)

## 2015-08-06 LAB — HEPATIC FUNCTION PANEL
ALBUMIN: 4.7 g/dL (ref 3.6–5.1)
ALT: 25 U/L (ref 9–46)
AST: 20 U/L (ref 10–40)
Alkaline Phosphatase: 29 U/L — ABNORMAL LOW (ref 40–115)
BILIRUBIN TOTAL: 0.5 mg/dL (ref 0.2–1.2)
Bilirubin, Direct: 0.1 mg/dL (ref <=0.2)
Indirect Bilirubin: 0.4 mg/dL (ref 0.2–1.2)
Total Protein: 7.1 g/dL (ref 6.1–8.1)

## 2015-08-06 MED ORDER — SIMVASTATIN 10 MG PO TABS: ORAL_TABLET | ORAL | Status: DC

## 2015-08-06 NOTE — Progress Notes (Signed)
    Patient ID: Kevin Paul MRN: [id number], DOB: Aug 06, 1975, 40 y.o. Date of Encounter: 08/06/2015, 12:14 PM    Chief Complaint:  Chief Complaint  Patient presents with  . lipid check/med refill    is fasting     HPI: 40 y.o. year old white male presents for above.   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  Exercises---cardio 3 days a week. Weights 2 days a week.  Feels good. No complaints/concerns.   Reviewed that at [place] 07/2014 he had hematuria and had f/u with urology. Says he did f/u with Urology. Had CT and Cysto --says everything was negative and "they couldn't find anything--told him to monitor".  Taking simvastatin daily. No myalgias or other adverse effects.   Currently working night shift.      Home Meds:   Outpatient Prescriptions Prior to Visit  Medication Sig Dispense Refill  . acyclovir (ZOVIRAX) 400 MG tablet TAKE 1 TABLET (400 MG TOTAL) BY MOUTH 3 (THREE) TIMES DAILY. FOR 5 DAYS 15 tablet 1  . simvastatin (ZOCOR) 10 MG tablet TAKE 1 TABLET (10 MG TOTAL) BY MOUTH AT BEDTIME. 30 tablet 0   No facility-administered medications prior to visit.    Allergies: No Known Allergies    Review of Systems: See HPI for pertinent ROS. All other ROS negative.    Physical Exam: Blood pressure 120/84, pulse 68, temperature 98 F (36.7 C), temperature source Oral, resp. rate 18, weight 200 lb (90.719 kg)., Body mass index is 29.52 kg/(m^2). General:  WNWD WM. Appears in no acute distress. Neck: Supple. No thyromegaly. No lymphadenopathy. No carotid bruits.  Lungs: Clear bilaterally to auscultation without wheezes, rales, or rhonchi. Breathing is unlabored. Heart: Regular rhythm. No murmurs, rubs, or gallops. Abdomen: Soft, non-tender, non-distended with normoactive bowel sounds. No hepatomegaly. No rebound/guarding. No obvious abdominal masses. Msk:  Strength and tone normal for age. Extremities/Skin: Warm and dry. No LE edema.  Neuro: Alert and oriented X 3. Moves  all extremities spontaneously. Gait is normal. CNII-XII grossly in tact. Psych:  Responds to questions appropriately with a normal affect.     ASSESSMENT AND PLAN:  40 y.o. year  old male with  1. Family history of coronary artery disease  2. Hyperlipidemia He is on simvastatin. Check FLP/LFT to monitor. - Lipid panel - Hepatic function panel - simvastatin (ZOCOR) 10 MG tablet; TAKE 1 TABLET (10 MG TOTAL) BY MOUTH AT BEDTIME.  Dispense: 90 tablet; Refill: 2   Signed, [address] [place], [place], [facility] 08/06/2015 12:14 PM

## 2015-08-08 ENCOUNTER — Encounter: Payer: Self-pay | Admitting: Family Medicine

## 2015-08-23 ENCOUNTER — Other Ambulatory Visit: Payer: Self-pay | Admitting: Physician Assistant

## 2015-08-23 NOTE — Telephone Encounter (Signed)
Medication refilled per protocol. 

## 2015-09-03 ENCOUNTER — Emergency Department (HOSPITAL_BASED_OUTPATIENT_CLINIC_OR_DEPARTMENT_OTHER)
Admission: EM | Admit: 2015-09-03 | Discharge: 2015-09-03 | Disposition: A | Discharge status: Home / Self Care | Payer: Worker's Compensation | Attending: Emergency Medicine | Admitting: Emergency Medicine

## 2015-09-03 ENCOUNTER — Encounter (HOSPITAL_BASED_OUTPATIENT_CLINIC_OR_DEPARTMENT_OTHER): Payer: Self-pay | Admitting: *Deleted

## 2015-09-03 ENCOUNTER — Emergency Department (HOSPITAL_BASED_OUTPATIENT_CLINIC_OR_DEPARTMENT_OTHER): Payer: Worker's Compensation

## 2015-09-03 DIAGNOSIS — Y929 Unspecified place or not applicable: Secondary | ICD-10-CM | POA: Insufficient documentation

## 2015-09-03 DIAGNOSIS — Y990 Civilian activity done for income or pay: Secondary | ICD-10-CM | POA: Insufficient documentation

## 2015-09-03 DIAGNOSIS — S8991XA Unspecified injury of right lower leg, initial encounter: Secondary | ICD-10-CM | POA: Diagnosis present

## 2015-09-03 DIAGNOSIS — Y9389 Activity, other specified: Secondary | ICD-10-CM | POA: Insufficient documentation

## 2015-09-03 DIAGNOSIS — W1789XA Other fall from one level to another, initial encounter: Secondary | ICD-10-CM | POA: Diagnosis not present

## 2015-09-03 DIAGNOSIS — M25561 Pain in right knee: Secondary | ICD-10-CM | POA: Diagnosis not present

## 2015-09-03 MED ORDER — IBUPROFEN 800 MG PO TABS
800.0000 mg | ORAL_TABLET | Freq: Once | ORAL | Status: DC
  Filled 2015-09-03: qty 1

## 2015-09-03 NOTE — ED Provider Notes (Signed)
CSN: [id number]     Arrival date & time 09/03/15  1958 History   First MD Initiated Contact with Patient 09/03/15 2039     Chief Complaint  Patient presents with  . Knee Injury   HPI  Kevin Paul is a 40 year old male presenting with a right knee injury. 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 Kevin Paul full weight landed on Kevin Paul right knee and Kevin Paul reports it buckled underneath him. Kevin Paul did not fall to the ground but reports immediate onset of medial knee pain. The pain does not radiate. The pain is exacerbated by ambulating but Kevin Paul has remained ambulatory. Denies associated swelling or instability of the knee. Kevin Paul has not taken any pain medications or to arrival. Kevin Paul reports a remote history of anterior cruciate ligament tear of the same knee. Kevin Paul follows with [facility]. Kevin Paul has no other complaints today.  Past Medical History  Diagnosis Date  . HSV-2 infection   . Cardiomegaly 08/24/2014    [facility]  . Hematuria 08/24/2014    exercise induced- [facility]   Past Surgical History  Procedure Laterality Date  . Anterior cruciate ligament repair  07/1994  . Knee arthroscopy Bilateral     Has had multiple knee surgeries in Kevin Paul Teens-20s   Family History  Problem Relation Age of Onset  . Hyperlipidemia Father   . Hypertension Father   . Heart disease Father 3    MI--stent  . Stroke Paternal Grandfather 73    Stroke   Social History  Substance Use Topics  . Smoking status: Never Smoker   . Smokeless tobacco: Never Used  . Alcohol Use: 2.4 oz/week    4 Cans of beer per week    Review of Systems  All other systems reviewed and are negative.     Allergies  Review of patient's allergies indicates no known allergies.  Home Medications   Prior to Admission medications   Medication Sig Start Date End Date Taking? Authorizing Provider  acyclovir (ZOVIRAX) 400 MG tablet TAKE 1 TABLET (400 MG TOTAL) BY MOUTH 3 (THREE) TIMES DAILY. FOR 5 DAYS  12/07/14   Orlena Sheldon, PA-C  simvastatin (ZOCOR) 10 MG tablet TAKE 1 TABLET BY MOUTH AT BEDTIME 08/23/15   Lonie Peak Dixon, PA-C   BP 155/79 mmHg  Pulse 80  Temp(Src) 98 F (36.7 C)  Resp 16  Ht 5' 10"  (1.778 m)  Wt 88.451 kg  BMI 27.98 kg/m2  SpO2 100% Physical Exam  Constitutional: Kevin Paul appears well-developed and well-nourished. No distress.  HENT:  Head: Normocephalic and atraumatic.  Right Ear: External ear normal.  Left Ear: External ear normal.  Eyes: Conjunctivae are normal. Right eye exhibits no discharge. Left eye exhibits no discharge. No scleral icterus.  Neck: Normal range of motion.  Cardiovascular: Normal rate.   Pulmonary/Chest: Effort normal.  Musculoskeletal: Normal range of motion.       Right knee: Kevin Paul exhibits normal range of motion, no swelling, no effusion, no deformity, no LCL laxity and no MCL laxity. Tenderness found. Medial joint line tenderness noted.  Mild tenderness to palpation along the medial joint line. Full range of motion of the knee intact and patient is ambulatory. No effusion. No ligamentous laxity.  Neurological: Kevin Paul is alert. Coordination normal.  5/5 strength of the bilateral lower extremities. Sensation to light touch intact throughout  Skin: Skin is warm and dry.  Psychiatric: Kevin Paul has a normal mood and affect. Kevin Paul behavior is normal.  Nursing note and vitals reviewed.   ED Course  Procedures (including critical care time) Labs Review Labs Reviewed - No data to display  Imaging Review Dg Knee Complete 4 Views Right  09/03/2015  CLINICAL DATA:  Twisted knee stepping out of truck with pain, initial encounter EXAM: RIGHT KNEE - COMPLETE 4+ VIEW COMPARISON:  None. FINDINGS: There are changes consistent with prior ACL reconstruction. No acute fracture or dislocation is identified. No gross soft tissue abnormality is seen. IMPRESSION: No acute abnormality noted. Electronically Signed   By: Inez Catalina M.D.   On: 09/03/2015 21:05   I have personally  reviewed and evaluated these images and lab results as part of my medical decision-making.   EKG Interpretation None      MDM   Final diagnoses:  Right knee pain   Patient presenting with right knee pain after a buckling injury. Right lower extremity is neurovascularly intact with FROM. Mild tenderness along the medial joint line. Patient X-Ray negative for obvious fracture or dislocation. Pain managed in ED with Motrin. Pt is able to ambulate with a steady gait. Brace given and conservative therapy recommended. Discussed RICE therapy and use of OTC pain relievers. Pt advised to follow up with orthopedics if symptoms persist. Return precautions discussed at bedside and given in discharge paperwork. Pt is stable for discharge.     Lahoma Crocker Dazani Norby, PA-C 09/03/15 2145  Gareth Morgan, MD 09/05/15 1702

## 2015-09-03 NOTE — ED Notes (Signed)
Pt c/o right knee injury x 1 hr ago

## 2015-09-03 NOTE — Discharge Instructions (Signed)
Knee Pain Knee pain is a very common symptom and can have many causes. Knee pain often goes away when you follow your health care provider's instructions for relieving pain and discomfort at home. However, knee pain can develop into a condition that needs treatment. Some conditions may include:  Arthritis caused by wear and tear (osteoarthritis).  Arthritis caused by swelling and irritation (rheumatoid arthritis or gout).  A cyst or growth in your knee.  An infection in your knee joint.  An injury that will not heal.  Damage, swelling, or irritation of the tissues that support your knee (torn ligaments or tendinitis). If your knee pain continues, additional tests may be ordered to diagnose your condition. Tests may include X-rays or other imaging studies of your knee. You may also need to have fluid removed from your knee. Treatment for ongoing knee pain depends on the cause, but treatment may include:  Medicines to relieve pain or swelling.  Steroid injections in your knee.  Physical therapy.  Surgery. HOME CARE INSTRUCTIONS  Take medicines only as directed by your health care provider.  Rest your knee and keep it raised (elevated) while you are resting.  Do not do things that cause or worsen pain.  Avoid high-impact activities or exercises, such as running, jumping rope, or doing jumping jacks.  Apply ice to the knee area:  Put ice in a plastic bag.  Place a towel between your skin and the bag.  Leave the ice on for 20 minutes, 2-3 times a day.  Ask your health care provider if you should wear an elastic knee support.  Keep a pillow under your knee when you sleep.  Lose weight if you are overweight. Extra weight can put pressure on your knee.  Do not use any tobacco products, including cigarettes, chewing tobacco, or electronic cigarettes. If you need help quitting, ask your health care provider. Smoking may slow the healing of any bone and joint problems that you may  have. SEEK MEDICAL CARE IF:  Your knee pain continues, changes, or gets worse.  You have a fever along with knee pain.  Your knee buckles or locks up.  Your knee becomes more swollen. SEEK IMMEDIATE MEDICAL CARE IF:   Your knee joint feels hot to the touch.  You have chest pain or trouble breathing.   This information is not intended to replace advice given to you by your health care provider. Make sure you discuss any questions you have with your health care provider.   Document Released: 01/05/2007 Document Revised: 03/31/2014 Document Reviewed: 10/24/2013 Elsevier Interactive Patient Education [employer].

## 2015-09-03 NOTE — ED Notes (Signed)
Pt verbalizes understanding of d/c instructions and denies any further needs at this time.

## 2015-12-17 ENCOUNTER — Ambulatory Visit (INDEPENDENT_AMBULATORY_CARE_PROVIDER_SITE_OTHER): Payer: 59 | Admitting: Physician Assistant

## 2015-12-17 ENCOUNTER — Encounter: Payer: Self-pay | Admitting: Physician Assistant

## 2015-12-17 DIAGNOSIS — G4726 Circadian rhythm sleep disorder, shift work type: Secondary | ICD-10-CM | POA: Diagnosis not present

## 2015-12-17 MED ORDER — ZOLPIDEM TARTRATE 10 MG PO TABS: 10.0000 mg | ORAL_TABLET | Freq: Every evening | ORAL | 2 refills | Status: DC | PRN

## 2015-12-17 NOTE — Progress Notes (Signed)
    Patient ID: Kevin Paul MRN: [id number], DOB: [birth year], 40 y.o. Date of Encounter: 12/17/2015, 9:33 AM    Chief Complaint:  Chief Complaint  Patient presents with  . office visit    trouble sleeping      HPI: 40 y.o. year old white male presents with above.   He works 3rd shift. Has trouble sleeping. He exercises. He has tried drinking certain teas. Has tried Melatonin. None have worked. Sleeps 2 hours then wakes up. Feels miserable. Needs med to help with sleep. Has never used Rx med for this.  No other complaint/concern.     Home Meds:   Outpatient Medications Prior to Visit  Medication Sig Dispense Refill  . acyclovir (ZOVIRAX) 400 MG tablet TAKE 1 TABLET (400 MG TOTAL) BY MOUTH 3 (THREE) TIMES DAILY. FOR 5 DAYS 15 tablet 1  . simvastatin (ZOCOR) 10 MG tablet TAKE 1 TABLET BY MOUTH AT BEDTIME 90 tablet 1   No facility-administered medications prior to visit.     Allergies: No Known Allergies    Review of Systems: See HPI for pertinent ROS. All other ROS negative.    Physical Exam: Blood pressure 128/82, pulse 69, temperature 97.7 F (36.5 C), temperature source Oral, resp. rate 18, weight 195 lb (88.5 kg)., Body mass index is 27.98 kg/m. General:  WNWD WM. Appears in no acute distress. Neck: Supple. No thyromegaly. No lymphadenopathy. Lungs: Clear bilaterally to auscultation without wheezes, rales, or rhonchi. Breathing is unlabored. Heart: Regular rhythm. No murmurs, rubs, or gallops. Msk:  Strength and tone normal for age. Extremities/Skin: Warm and dry.  Neuro: Alert and oriented X 3. Moves all extremities spontaneously. Gait is normal. CNII-XII grossly in tact. Psych:  Responds to questions appropriately with a normal affect.     ASSESSMENT AND PLAN:  40 y.o. year old male with  1. Shift work sleep disorder Told him about Ambien CR. Told him if this does not keep him asleep for 8 hours, call and I will change this to Ambien CR. But, if this  works, will be cheaper. Call me if this causes adverse effects or if it does not work for him.  - zolpidem (AMBIEN) 10 MG tablet; Take 1 tablet (10 mg total) by mouth at bedtime as needed for sleep.  Dispense: 30 tablet; Refill: 2   Signed, [address] [place], [place], [facility] 12/17/2015 9:33 AM

## 2016-02-21 ENCOUNTER — Ambulatory Visit (INDEPENDENT_AMBULATORY_CARE_PROVIDER_SITE_OTHER): Payer: 59 | Admitting: Physician Assistant

## 2016-02-21 ENCOUNTER — Encounter: Payer: Self-pay | Admitting: Physician Assistant

## 2016-02-21 VITALS — BP 118/80 | HR 74 | Temp 98.0°F | Resp 16 | Wt 200.0 lb

## 2016-02-21 DIAGNOSIS — R300 Dysuria: Secondary | ICD-10-CM | POA: Diagnosis not present

## 2016-02-21 LAB — URINALYSIS, ROUTINE W REFLEX MICROSCOPIC
Bilirubin Urine: NEGATIVE
Glucose, UA: NEGATIVE
Hgb urine dipstick: NEGATIVE
Ketones, ur: NEGATIVE
Leukocytes, UA: NEGATIVE
NITRITE: NEGATIVE
Protein, ur: NEGATIVE
SPECIFIC GRAVITY, URINE: 1.015 (ref 1.001–1.035)
pH: 6 (ref 5.0–8.0)

## 2016-02-21 NOTE — Progress Notes (Signed)
Patient ID: Kevin Paul MRN: [id number], DOB: 17-Aug-1975, 40 y.o. Date of Encounter: @DATE @  Chief Complaint:  Chief Complaint  Patient presents with  . Urinary Tract Infection    burning    HPI: 40 y.o. year old male  presents with above.   Says that he has been feeling some mild burning with urination. Says he also feels a little bit of achy discomfort around both sides of his low back and he did not know if this was related. No pain at the costophrenic angle region. Says he has seen no blood in the urine. Has had no penile discharge. No nausea or vomiting. No fevers. No chills. Has just a very mild burning with urination.   Past Medical History:  Diagnosis Date  . Cardiomegaly 08/24/2014   [facility]  . Hematuria 08/24/2014   exercise induced- [facility]  . HSV-2 infection      Home Meds: Outpatient Medications Prior to Visit  Medication Sig Dispense Refill  . acyclovir (ZOVIRAX) 400 MG tablet TAKE 1 TABLET (400 MG TOTAL) BY MOUTH 3 (THREE) TIMES DAILY. FOR 5 DAYS 15 tablet 1  . simvastatin (ZOCOR) 10 MG tablet TAKE 1 TABLET BY MOUTH AT BEDTIME 90 tablet 1  . zolpidem (AMBIEN) 10 MG tablet Take 1 tablet (10 mg total) by mouth at bedtime as needed for sleep. 30 tablet 2   No facility-administered medications prior to visit.     Allergies: No Known Allergies  Social History   Social History  . Marital status: Married    Spouse name: N/A  . Number of children: N/A  . Years of education: N/A   Occupational History  . Not on file.   Social History Main Topics  . Smoking status: Never Smoker  . Smokeless tobacco: Never Used  . Alcohol use 2.4 oz/week    4 Cans of beer per week  . Drug use: No  . Sexual activity: Yes   Other Topics Concern  . Not on file   Social History Narrative   Paramedic for [employer]   Married. 3 children--2 are step children  1 is his own biologic child: Ages 2, 20, 41.   Last year started exercising: Runs 3  miles / day 3-4 days / week.   Also does push-ups, sit ups etc at home.    THIS  INFO  ENTERED  06/22/2013    Family History  Problem Relation Age of Onset  . Hyperlipidemia Father   . Hypertension Father   . Heart disease Father 28    MI--stent  . Stroke Paternal Grandfather 43    Stroke     Review of Systems:  See HPI for pertinent ROS. All other ROS negative.    Physical Exam: Blood pressure 118/80, pulse 74, temperature 98 F (36.7 C), temperature source Oral, resp. rate 16, weight 200 lb (90.7 kg), SpO2 98 %., Body mass index is 28.7 kg/m. General: WNWD WM. Appears in no acute distress. Neck: Supple. No thyromegaly. No lymphadenopathy. Lungs: Clear bilaterally to auscultation without wheezes, rales, or rhonchi. Breathing is unlabored. Heart: RRR with S1 S2. No murmurs, rubs, or gallops. Abdomen: Soft, non-tender, non-distended with normoactive bowel sounds. No hepatomegaly. No rebound/guarding. No obvious abdominal masses. No area of tenderness with palpation. Even the suprapubic region no area of tenderness. Musculoskeletal:  Strength and tone normal for age. No tenderness with percussion to costophrenic angles bilaterally. Extremities/Skin: Warm and dry. No clubbing or cyanosis. No edema. No rashes  or suspicious lesions. Neuro: Alert and oriented X 3. Moves all extremities spontaneously. Gait is normal. CNII-XII grossly in tact. Psych:  Responds to questions appropriately with a normal affect.     ASSESSMENT AND PLAN:  40 y.o. year old male with  1. Burning with urination Urinalysis negative. Will check for  gonorrhea chlamydia. If tests are negative but symptoms persist--- then will refer to urology for follow-up. Will follow-up with him once I get rest of test results. - Urinalysis, Routine w reflex microscopic (not at [facility]) - GC/Chlamydia Probe Amp - Urine culture   Signed, [facility] [place], [place], [facility] 02/21/2016 10:29 AM

## 2016-02-22 LAB — GC/CHLAMYDIA PROBE AMP
CT PROBE, AMP APTIMA: NOT DETECTED
GC Probe RNA: NOT DETECTED

## 2016-02-23 LAB — URINE CULTURE: ORGANISM ID, BACTERIA: NO GROWTH

## 2016-03-13 ENCOUNTER — Other Ambulatory Visit: Payer: Self-pay | Admitting: Physician Assistant

## 2016-03-13 NOTE — Telephone Encounter (Signed)
Approved. #[id number].

## 2016-03-13 NOTE — Telephone Encounter (Signed)
Ok to refill 

## 2016-03-13 NOTE — Telephone Encounter (Signed)
Medication called to pharmacy.

## 2016-03-18 ENCOUNTER — Other Ambulatory Visit: Payer: Self-pay | Admitting: Physician Assistant

## 2016-03-20 NOTE — Telephone Encounter (Signed)
rx filled per protocol

## 2016-06-12 ENCOUNTER — Other Ambulatory Visit: Payer: Self-pay | Admitting: Physician Assistant

## 2016-06-12 NOTE — Telephone Encounter (Signed)
Approved. #[id number].

## 2016-06-12 NOTE — Telephone Encounter (Signed)
Last OV 9-25 Last refill 03-13-16 Ok to refill?

## 2016-06-13 NOTE — Telephone Encounter (Signed)
Rx called in 

## 2016-07-28 ENCOUNTER — Other Ambulatory Visit: Payer: Self-pay | Admitting: Physician Assistant

## 2016-07-29 ENCOUNTER — Other Ambulatory Visit: Payer: Self-pay

## 2016-07-29 NOTE — Telephone Encounter (Signed)
Last OV 02/21/2016 Last refill 12/07/2014 Ok to refill?

## 2016-07-30 MED ORDER — ACYCLOVIR 400 MG PO TABS: ORAL_TABLET | ORAL | 5 refills | Status: DC

## 2016-07-30 NOTE — Telephone Encounter (Signed)
#  15 + 5 

## 2016-08-10 ENCOUNTER — Other Ambulatory Visit: Payer: Self-pay | Admitting: Physician Assistant

## 2016-08-10 DIAGNOSIS — E785 Hyperlipidemia, unspecified: Secondary | ICD-10-CM

## 2016-08-11 NOTE — Telephone Encounter (Signed)
Rx filled pt due for office visit. Letter mailed

## 2016-09-08 ENCOUNTER — Other Ambulatory Visit: Payer: 59

## 2016-09-10 ENCOUNTER — Ambulatory Visit (INDEPENDENT_AMBULATORY_CARE_PROVIDER_SITE_OTHER): Payer: 59 | Admitting: Physician Assistant

## 2016-09-10 ENCOUNTER — Encounter: Payer: Self-pay | Admitting: Physician Assistant

## 2016-09-10 VITALS — BP 142/90 | HR 85 | Temp 98.0°F | Resp 18 | Ht 70.0 in | Wt 197.2 lb

## 2016-09-10 DIAGNOSIS — Z8249 Family history of ischemic heart disease and other diseases of the circulatory system: Secondary | ICD-10-CM

## 2016-09-10 DIAGNOSIS — B009 Herpesviral infection, unspecified: Secondary | ICD-10-CM

## 2016-09-10 DIAGNOSIS — G4726 Circadian rhythm sleep disorder, shift work type: Secondary | ICD-10-CM | POA: Diagnosis not present

## 2016-09-10 DIAGNOSIS — Z0000 Encounter for general adult medical examination without abnormal findings: Secondary | ICD-10-CM

## 2016-09-10 DIAGNOSIS — E785 Hyperlipidemia, unspecified: Secondary | ICD-10-CM | POA: Diagnosis not present

## 2016-09-10 LAB — COMPLETE METABOLIC PANEL WITH GFR
ALBUMIN: 4.7 g/dL (ref 3.6–5.1)
ALK PHOS: 30 U/L — AB (ref 40–115)
ALT: 17 U/L (ref 9–46)
AST: 16 U/L (ref 10–40)
BUN: 19 mg/dL (ref 7–25)
CO2: 21 mmol/L (ref 20–31)
Calcium: 9.5 mg/dL (ref 8.6–10.3)
Chloride: 106 mmol/L (ref 98–110)
Creat: 1.04 mg/dL (ref 0.60–1.35)
GFR, Est Non African American: 89 mL/min (ref >=60)
GLUCOSE: 93 mg/dL (ref 70–99)
Potassium: 4.1 mmol/L (ref 3.5–5.3)
SODIUM: 141 mmol/L (ref 135–146)
TOTAL PROTEIN: 7.3 g/dL (ref 6.1–8.1)
Total Bilirubin: 0.6 mg/dL (ref 0.2–1.2)

## 2016-09-10 LAB — CBC WITH DIFFERENTIAL/PLATELET
BASOS ABS: 0 {cells}/uL (ref 0–200)
Basophils Relative: 0 %
EOS PCT: 9 %
Eosinophils Absolute: 468 cells/uL (ref 15–500)
HCT: 44 % (ref 38.5–50.0)
HEMOGLOBIN: 15 g/dL (ref 13.0–17.0)
LYMPHS ABS: 1976 {cells}/uL (ref 850–3900)
Lymphocytes Relative: 38 %
MCH: 30 pg (ref 27.0–33.0)
MCHC: 34.1 g/dL (ref 32.0–36.0)
MCV: 88 fL (ref 80.0–100.0)
MPV: 10 fL (ref 7.5–12.5)
Monocytes Absolute: 416 cells/uL (ref 200–950)
Monocytes Relative: 8 %
NEUTROS PCT: 45 %
Neutro Abs: 2340 cells/uL (ref 1500–7800)
Platelets: 260 10*3/uL (ref 140–400)
RBC: 5 MIL/uL (ref 4.20–5.80)
RDW: 13 % (ref 11.0–15.0)
WBC: 5.2 10*3/uL (ref 3.8–10.8)

## 2016-09-10 LAB — LIPID PANEL
Cholesterol: 144 mg/dL (ref <200)
HDL: 46 mg/dL (ref >40)
LDL Cholesterol: 88 mg/dL (ref <100)
Total CHOL/HDL Ratio: 3.1 Ratio (ref <5.0)
Triglycerides: 50 mg/dL (ref <150)
VLDL: 10 mg/dL (ref <30)

## 2016-09-10 LAB — TSH: TSH: 0.71 mIU/L (ref 0.40–4.50)

## 2016-09-10 MED ORDER — ZOLPIDEM TARTRATE 10 MG PO TABS: 10.0000 mg | ORAL_TABLET | Freq: Every evening | ORAL | 2 refills | Status: DC | PRN

## 2016-09-10 MED ORDER — SIMVASTATIN 10 MG PO TABS: 10.0000 mg | ORAL_TABLET | Freq: Every day | ORAL | 1 refills | Status: DC

## 2016-09-10 MED ORDER — ACYCLOVIR 400 MG PO TABS: ORAL_TABLET | ORAL | 5 refills | Status: DC

## 2016-09-10 NOTE — Progress Notes (Signed)
Patient ID: Kevin Paul MRN: [id number], DOB: [birth year] 41 y.o. Date of Encounter: 09/10/2016, 10:44 AM    Chief Complaint: Physical (CPE)  HPI: 41 y.o. y/o white male here for CPE.   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 States that he still works the night shift. However he recently has been doing some additional work--during days.  When he initially establish care with me ---did receive records from his prior PCP which was  with [facility] physicians. Records were reviewed but basically include no significant history except for a history of herpes simplex type II and only medication is acyclovir - which he uses as needed. At his initial visit he said that he is married and his wife also has a history of herpes so there is no reason to take suppressive therapy in terms of concern of spreading infection. He says that he rarely has a flare / episode so he sees no reason to take daily suppressive therapy in regards to this.   Given his family history of father with CAD and paternal grandfather with stroke --- he is on low-dose simvastatin.  Today I also reviewed prior office notes. He had OV with me 07/2014 at which time he had seen one episode of clot of blood in his urine. He did follow-up with urology. Had CT and cystoscopy and reported that "everything was negative and they couldn't find anything told him to monitor". Today he reports that he has seen no further blood in his urine.  Also reviewed his OV note from 12/17/15. At that time he reported that he works third shift. Was having trouble sleeping. He exercises. He had tried drinking certain teas. Tried melatonin. None of this had worked. Would sleep for 2 hours and wake up. Was feeling miserable. Was needing a medication to help with sleep. Prior to that -- had never used any Rx meds for insomnia. At that visit prescribed  Ambien.  Today he reports that he has continued to use the Ambien as needed. His days off he does not need the medication and does not take it then. On days that he has worked and needs the medication to sleep and he uses the Ambien and it works very well for him.  He is taking the simvastatin daily. This is causing no myalgias no right upper quadrant pain.  Today he reports that his blood pressure reading today here today was 140/90 and he has been getting similar readings when he is checking it at work. Says that he is in the midst of selling a house in building a house and is wondering if the stress of that is contributing to his blood pressure reading higher recently. States that his house is on the market and they have also already had multiple phone calls about it. They're going to live with his sister for a while. They're new house should be billed and ready in 6-7 months. Patient states that he is married with one child. Think he said is 42 years old. Visit his sister is married with a child also that same age as well as another younger child.  Says that recently because of some knee problems he had decreased his cardio but plans to increase that back up and see if that helps with his blood pressure. Regarding diet says that there is been no change with his diet.  Says that he wasn't having to watch that will closely in the past and that has not really changed at all. He is asking my opinion about whether he needs to go ahead and get on blood pressure medicine versus monitoring that for a while.  He has no other concerns to address today.      Review of Systems: Consitutional: No fever, chills, fatigue, night sweats, lymphadenopathy, or weight changes. Eyes: No visual changes, eye redness, or discharge. ENT/Mouth: Ears: No otalgia, tinnitus, hearing loss, discharge. Nose: No congestion, rhinorrhea, sinus pain, or epistaxis. Throat: No sore throat, post nasal drip, or teeth  pain. Cardiovascular: No CP, palpitations, diaphoresis, DOE, edema, orthopnea, PND. Respiratory: No cough, hemoptysis, SOB, or wheezing. Gastrointestinal: No anorexia, dysphagia, reflux, pain, nausea, vomiting, hematemesis, diarrhea, constipation, BRBPR, or melena. Genitourinary: No dysuria, frequency, urgency, hematuria, incontinence, nocturia, decreased urinary stream, discharge, impotence, or testicular pain/masses. Musculoskeletal: No decreased ROM, myalgias, stiffness, joint swelling, or weakness. Skin: No rash, erythema, lesion changes, pain, warmth, jaundice, or pruritis. Neurological: No headache, dizziness, syncope, seizures, tremors, memory loss, coordination problems, or paresthesias. Psychological: No anxiety, depression, hallucinations, SI/HI. Endocrine: No fatigue, polydipsia, polyphagia, polyuria, or known diabetes. All other systems were reviewed and are otherwise negative.  Past Medical History:  Diagnosis Date  . Cardiomegaly 08/24/2014   [facility]  . Hematuria 08/24/2014   exercise induced- [facility]  . HSV-2 infection      Past Surgical History:  Procedure Laterality Date  . ANTERIOR CRUCIATE LIGAMENT REPAIR  07/1994  . KNEE ARTHROSCOPY Bilateral    Has had multiple knee surgeries in his Teens-20s    Home Meds:  Current Outpatient Prescriptions on File Prior to Visit  Medication Sig Dispense Refill  . acyclovir (ZOVIRAX) 400 MG tablet TAKE 1 TABLET (400 MG TOTAL) BY MOUTH 3 (THREE) TIMES DAILY. FOR 5 DAYS 15 tablet 5  . simvastatin (ZOCOR) 10 MG tablet TAKE 1 TABLET BY MOUTH AT BEDTIME 90 tablet 0  . zolpidem (AMBIEN) 10 MG tablet Take 1 tablet (10 mg total) by mouth at bedtime as needed for sleep. 30 tablet 2   No current facility-administered medications on file prior to visit.     Allergies: No Known Allergies  Social History   Social History  . Marital status: Married    Spouse name: N/A  . Number of children: N/A  . Years of education:  N/A   Occupational History  . Not on file.   Social History Main Topics  . Smoking status: Never Smoker  . Smokeless tobacco: Never Used  . Alcohol use 2.4 oz/week    4 Cans of beer per week  . Drug use: No  . Sexual activity: Yes   Other Topics Concern  . Not on file   Social History Narrative   Paramedic for [employer]   Married. 3 children--2 are step children  1 is his own biologic child: Ages 29, 72, 21.   Last year started exercising: Runs 3 miles / day 3-4 days / week.   Also does push-ups, sit ups etc at home.    THIS  INFO  ENTERED  06/22/2013    Family History  Problem Relation Age of Onset  . Hyperlipidemia Father   . Hypertension Father   . Heart disease Father 75       MI--stent  . Stroke Paternal Grandfather 3       Stroke    Physical Exam: Blood pressure (!) 142/90, pulse 85, temperature 98 F (  36.7 C), temperature source Oral, resp. rate 18, height 5' 10"  (1.778 m), weight 197 lb 3.2 oz (89.4 kg), SpO2 97 %.  General: Well developed, well nourished, WM. Appears in no acute distress. HEENT: Normocephalic, atraumatic. Conjunctiva pink, sclera non-icteric. Pupils 2 mm constricting to 1 mm, round, regular, and equally reactive to light and accomodation. EOMI. Internal auditory canal clear. TMs with good cone of light and without pathology. Nasal mucosa pink. Nares are without discharge. No sinus tenderness. Oral mucosa pink. Dentition normal.. Pharynx without exudate.   Neck: Supple. Trachea midline. No thyromegaly. Full ROM. No lymphadenopathy. Lungs: Clear to auscultation bilaterally without wheezes, rales, or rhonchi. Breathing is of normal effort and unlabored. Cardiovascular: RRR with S1 S2. No murmurs, rubs, or gallops. Distal pulses 2+ symmetrically. No carotid or abdominal bruits. Abdomen: Soft, non-tender, non-distended with normoactive bowel sounds. No hepatosplenomegaly or masses. No rebound/guarding. No CVA tenderness. No  hernias. Musculoskeletal: Full range of motion and 5/5 strength throughout. Without swelling, atrophy, tenderness, crepitus, or warmth. Extremities without clubbing, cyanosis, or edema. Calves supple. Skin: Warm and moist without erythema, ecchymosis, wounds, or rash. Neuro: A+Ox3. CN II-XII grossly intact. Moves all extremities spontaneously. Full sensation throughout. Normal gait. DTR 2+ throughout upper and lower extremities. Finger to nose intact. Psych:  Responds to questions appropriately with a normal affect.   Assessment/Plan:  41 y.o. y/o  male here for CPE -1. Visit for preventive health examination  A. Screening Labs:  He works nights. Says that when he got home from work he did eat one cookie. Had no other food or drink in the past 8 hours. Does want to go ahead and do labs. - CBC with Differential - COMPLETE METABOLIC PANEL WITH GFR - Lipid panel - Vit D  25 hydroxy (rtn osteoporosis monitoring)  B. Immunizations:  Patient says he had a tetanus vaccine in the past 2 years. Says he had the flu vaccine in fall of 2014. He says this may have been done at [facility] urgent care or at their (for Paramedics) " training department". He says that he will get a copy of all of this and bring it to our office.  2. Family history of coronary artery disease Father with history of MI at age 40. Patient does note that his father was a smoker. Paternal grandfather with history of stroke and death in his 52s. Patient also as this grandfather was also a smoker.  3. HSV-2 infection - acyclovir (ZOVIRAX) 400 MG tablet; Take 1 tablet (400 mg total) by mouth 3 (three) times daily. For 5 days--as needed for flares.   Dispense: 15 tablet; Refill: 11  He will continue to monitor his blood pressure. If he gets higher readings and it is consistently running >140/90--- then he will follow-up with me. Otherwise hopefully once the stress regarding this move is over and he gets back to his cardio exercise  hopefully his blood pressure will decrease.  Will Call patient with results of labs. If these are normal then otherwise we'll plan for follow up in one year or sooner if needed.   Signed:   [address] [place], [place]  09/10/2016 10:44 AM

## 2016-12-15 ENCOUNTER — Other Ambulatory Visit: Payer: Self-pay | Admitting: Physician Assistant

## 2016-12-15 DIAGNOSIS — G4726 Circadian rhythm sleep disorder, shift work type: Secondary | ICD-10-CM

## 2016-12-15 NOTE — Telephone Encounter (Signed)
Last OV 09/10/2016 LAST REFILL 09/10/2016-10/10/2016 Ok to refill?

## 2016-12-15 NOTE — Telephone Encounter (Signed)
Approved #[id number].

## 2016-12-15 NOTE — Telephone Encounter (Signed)
rx called into pharmacy

## 2016-12-25 DIAGNOSIS — M5137 Other intervertebral disc degeneration, lumbosacral region: Secondary | ICD-10-CM | POA: Diagnosis not present

## 2016-12-25 DIAGNOSIS — M9903 Segmental and somatic dysfunction of lumbar region: Secondary | ICD-10-CM | POA: Diagnosis not present

## 2016-12-25 DIAGNOSIS — M9901 Segmental and somatic dysfunction of cervical region: Secondary | ICD-10-CM | POA: Diagnosis not present

## 2016-12-30 DIAGNOSIS — M5137 Other intervertebral disc degeneration, lumbosacral region: Secondary | ICD-10-CM | POA: Diagnosis not present

## 2016-12-30 DIAGNOSIS — M9901 Segmental and somatic dysfunction of cervical region: Secondary | ICD-10-CM | POA: Diagnosis not present

## 2016-12-30 DIAGNOSIS — M9903 Segmental and somatic dysfunction of lumbar region: Secondary | ICD-10-CM | POA: Diagnosis not present

## 2017-01-02 DIAGNOSIS — M9903 Segmental and somatic dysfunction of lumbar region: Secondary | ICD-10-CM | POA: Diagnosis not present

## 2017-01-02 DIAGNOSIS — M9901 Segmental and somatic dysfunction of cervical region: Secondary | ICD-10-CM | POA: Diagnosis not present

## 2017-01-02 DIAGNOSIS — M5137 Other intervertebral disc degeneration, lumbosacral region: Secondary | ICD-10-CM | POA: Diagnosis not present

## 2017-03-15 ENCOUNTER — Other Ambulatory Visit: Payer: Self-pay | Admitting: Physician Assistant

## 2017-03-15 DIAGNOSIS — G4726 Circadian rhythm sleep disorder, shift work type: Secondary | ICD-10-CM

## 2017-03-16 NOTE — Telephone Encounter (Signed)
rx called in

## 2017-03-16 NOTE — Telephone Encounter (Signed)
Approved.  #[id number].

## 2017-03-16 NOTE — Telephone Encounter (Signed)
Last OV 09/10/2016 LAST REFILL 12/15/2016 Ok to refill?

## 2017-06-01 DIAGNOSIS — M79672 Pain in left foot: Secondary | ICD-10-CM | POA: Diagnosis not present

## 2017-06-01 DIAGNOSIS — Q6689 Other  specified congenital deformities of feet: Secondary | ICD-10-CM | POA: Diagnosis not present

## 2017-06-01 DIAGNOSIS — M79671 Pain in right foot: Secondary | ICD-10-CM | POA: Diagnosis not present

## 2017-06-02 DIAGNOSIS — M9903 Segmental and somatic dysfunction of lumbar region: Secondary | ICD-10-CM | POA: Diagnosis not present

## 2017-06-02 DIAGNOSIS — M9901 Segmental and somatic dysfunction of cervical region: Secondary | ICD-10-CM | POA: Diagnosis not present

## 2017-06-02 DIAGNOSIS — M5137 Other intervertebral disc degeneration, lumbosacral region: Secondary | ICD-10-CM | POA: Diagnosis not present

## 2017-07-12 ENCOUNTER — Other Ambulatory Visit: Payer: Self-pay | Admitting: Physician Assistant

## 2017-07-12 DIAGNOSIS — E785 Hyperlipidemia, unspecified: Secondary | ICD-10-CM

## 2017-09-22 ENCOUNTER — Other Ambulatory Visit: Payer: Self-pay | Admitting: Physician Assistant

## 2017-09-22 DIAGNOSIS — G4726 Circadian rhythm sleep disorder, shift work type: Secondary | ICD-10-CM

## 2017-09-22 NOTE — Telephone Encounter (Signed)
Last OV 09/10/2016 Last refill 03/16/2017 Ok to refill?

## 2017-09-23 NOTE — Telephone Encounter (Signed)
He is also on simvastatin.  His last labs were 08/2016.  He needs to come in for an office visit.  Needs to come fasting to that visit. Can send  #30+0 of Ambien to hold him over until visit.

## 2017-09-23 NOTE — Telephone Encounter (Signed)
Call placed to patient to schedule an appointment however patient was out of town and states he will call back to schedule at a later time

## 2017-10-15 ENCOUNTER — Ambulatory Visit: Payer: 59 | Admitting: Physician Assistant

## 2017-10-15 ENCOUNTER — Encounter: Payer: Self-pay | Admitting: Physician Assistant

## 2017-10-15 ENCOUNTER — Other Ambulatory Visit: Payer: Self-pay

## 2017-10-15 VITALS — BP 130/82 | HR 63 | Temp 97.6°F | Resp 16 | Ht 70.25 in | Wt 196.0 lb

## 2017-10-15 DIAGNOSIS — Z8249 Family history of ischemic heart disease and other diseases of the circulatory system: Secondary | ICD-10-CM | POA: Diagnosis not present

## 2017-10-15 DIAGNOSIS — B009 Herpesviral infection, unspecified: Secondary | ICD-10-CM

## 2017-10-15 DIAGNOSIS — E785 Hyperlipidemia, unspecified: Secondary | ICD-10-CM | POA: Diagnosis not present

## 2017-10-15 DIAGNOSIS — Z0000 Encounter for general adult medical examination without abnormal findings: Secondary | ICD-10-CM | POA: Diagnosis not present

## 2017-10-15 DIAGNOSIS — G4726 Circadian rhythm sleep disorder, shift work type: Secondary | ICD-10-CM

## 2017-10-15 LAB — LIPID PANEL
CHOLESTEROL: 170 mg/dL (ref <200)
HDL: 50 mg/dL (ref >40)
LDL Cholesterol (Calc): 104 mg/dL (calc) — ABNORMAL HIGH
Non-HDL Cholesterol (Calc): 120 mg/dL (calc) (ref <130)
Total CHOL/HDL Ratio: 3.4 (calc) (ref <5.0)
Triglycerides: 74 mg/dL (ref <150)

## 2017-10-15 LAB — CBC WITH DIFFERENTIAL/PLATELET
BASOS ABS: 17 {cells}/uL (ref 0–200)
Basophils Relative: 0.3 %
EOS ABS: 441 {cells}/uL (ref 15–500)
Eosinophils Relative: 7.6 %
HCT: 44.1 % (ref 38.5–50.0)
Hemoglobin: 15.5 g/dL (ref 13.2–17.1)
Lymphs Abs: 2552 cells/uL (ref 850–3900)
MCH: 31.4 pg (ref 27.0–33.0)
MCHC: 35.1 g/dL (ref 32.0–36.0)
MCV: 89.3 fL (ref 80.0–100.0)
MPV: 10.3 fL (ref 7.5–12.5)
Monocytes Relative: 10.4 %
Neutro Abs: 2187 cells/uL (ref 1500–7800)
Neutrophils Relative %: 37.7 %
PLATELETS: 245 10*3/uL (ref 140–400)
RBC: 4.94 10*6/uL (ref 4.20–5.80)
RDW: 12.4 % (ref 11.0–15.0)
TOTAL LYMPHOCYTE: 44 %
WBC mixed population: 603 cells/uL (ref 200–950)
WBC: 5.8 10*3/uL (ref 3.8–10.8)

## 2017-10-15 LAB — COMPLETE METABOLIC PANEL WITH GFR
AG Ratio: 2.2 (calc) (ref 1.0–2.5)
ALBUMIN MSPROF: 5 g/dL (ref 3.6–5.1)
ALKALINE PHOSPHATASE (APISO): 35 U/L — AB (ref 40–115)
ALT: 18 U/L (ref 9–46)
AST: 18 U/L (ref 10–40)
BILIRUBIN TOTAL: 0.8 mg/dL (ref 0.2–1.2)
BUN: 19 mg/dL (ref 7–25)
CHLORIDE: 103 mmol/L (ref 98–110)
CO2: 28 mmol/L (ref 20–32)
Calcium: 9.6 mg/dL (ref 8.6–10.3)
Creat: 1.01 mg/dL (ref 0.60–1.35)
GFR, Est African American: 106 mL/min/{1.73_m2} (ref >=60)
GFR, Est Non African American: 91 mL/min/{1.73_m2} (ref >=60)
Globulin: 2.3 g/dL (calc) (ref 1.9–3.7)
Glucose, Bld: 91 mg/dL (ref 65–99)
Potassium: 4.4 mmol/L (ref 3.5–5.3)
Sodium: 138 mmol/L (ref 135–146)
Total Protein: 7.3 g/dL (ref 6.1–8.1)

## 2017-10-15 MED ORDER — ZOLPIDEM TARTRATE 10 MG PO TABS: 10.0000 mg | ORAL_TABLET | Freq: Every evening | ORAL | 5 refills | Status: DC | PRN

## 2017-10-15 MED ORDER — ACYCLOVIR 400 MG PO TABS: ORAL_TABLET | ORAL | 5 refills | Status: DC

## 2017-10-15 MED ORDER — SIMVASTATIN 10 MG PO TABS: 10.0000 mg | ORAL_TABLET | Freq: Every day | ORAL | 2 refills | Status: DC

## 2017-10-15 NOTE — Progress Notes (Signed)
Patient ID: ROCIO WOLAK MRN: [id number], DOB: 08-30-1975 42 y.o. Date of Encounter: 10/15/2017, 8:44 AM    Chief Complaint: Physical (CPE)  HPI: 42 y.o. y/o white male here for CPE.   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 However he recently has been doing some additional work--during days.  When he initially establish care with me ---did receive records from his prior PCP which was  with [facility] physicians. Records were reviewed but basically include no significant history except for a history of herpes simplex type II and only medication is acyclovir - which he uses as needed. At his initial visit he said that he is married and his wife also has a history of herpes so there is no reason to take suppressive therapy in terms of concern of spreading infection. He says that he rarely has a flare / episode so he sees no reason to take daily suppressive therapy in regards to this.   Given his family history of father with CAD and paternal grandfather with stroke --- he is on low-dose simvastatin.  Today I also reviewed prior office notes. He had OV with me 07/2014 at which time he had seen one episode of clot of blood in his urine. He did follow-up with urology. Had CT and cystoscopy and reported that "everything was negative and they couldn't find anything told him to monitor". Today he reports that he has seen no further blood in his urine.  Also reviewed his OV note from 12/17/15. At that time he reported that he works third shift. Was having trouble sleeping. He exercises. He had tried drinking certain teas. Tried melatonin. None of this had worked. Would sleep for 2 hours and wake up. Was feeling miserable. Was needing a medication to help with sleep. Prior to that -- had never used any Rx meds for insomnia. At that visit prescribed  Ambien.  ----------------------------------------------------------------------- At CPE 09/10/2016: Today he reports that his blood pressure reading today here today was 140/90 and he has been getting similar readings when he is checking it at work. Says that he is in the midst of selling a house in building a house and is wondering if the stress of that is contributing to his blood pressure reading higher recently. States that his house is on the market and they have also already had multiple phone calls about it. They're going to live with his sister for a while. They're new house should be billed and ready in 6-7 months. Patient states that he is married with one child. Think he said is 43 years old. Visit his sister is married with a child also that same age as well as another younger child.  Says that recently because of some knee problems he had decreased his cardio but plans to increase that back up and see if that helps with his blood pressure. Regarding diet says that there is been no change with his diet. Says that he wasn't having to watch that will closely in the past and that has not really changed at all. He is asking my opinion about whether he needs to go ahead and get on blood pressure medicine versus monitoring that for a while. --------------------------------------------------------------------------------------------------  10/15/2017: He reports that they did get moved into their new house in January.  No longer living with  his sister etc.  States that he definitely feels a lot better and does think that he was experiencing quite a bit of increased stress at time of last visit.  Also he has started doing some running and cardio again.  Trying to eat healthier.  Has been checking blood pressure some and getting good readings.  Overall feels good.  Today he reports that he has continued to use the Ambien as needed. His days off he does not need the medication and does not take it  then. On days that he has worked and needs the medication to sleep and he uses the Ambien and it works very well for him.  He is taking the simvastatin daily. This is causing no myalgias no right upper quadrant pain.  He reports that he has one thing he wanted to discuss today.  States that sometimes when he is eating meat such as steak or chicken it was feel like it is getting stuck in his throat a little bit.  However he states that he has gotten in a bad habit of feeling like he has to rush when he eats or will be really hungry and because of that will eat very quickly.  Now if it is just because he is eating too fast or if it is because of an underlying issue.  Wanted to discuss this today.   He has no other concerns to address today.  Reports that he has 1 biologic child who is a daughter and age 43.  Also has 2 stepchildren.    Review of Systems: Consitutional: No fever, chills, fatigue, night sweats, lymphadenopathy, or weight changes. Eyes: No visual changes, eye redness, or discharge. ENT/Mouth: Ears: No otalgia, tinnitus, hearing loss, discharge. Nose: No congestion, rhinorrhea, sinus pain, or epistaxis. Throat: No sore throat, post nasal drip, or teeth pain. Cardiovascular: No CP, palpitations, diaphoresis, DOE, edema, orthopnea, PND. Respiratory: No cough, hemoptysis, SOB, or wheezing. Gastrointestinal: No anorexia,  reflux, pain, nausea, vomiting, hematemesis, diarrhea, constipation, BRBPR, or melena. Genitourinary: No dysuria, frequency, urgency, hematuria, incontinence, nocturia, decreased urinary stream, discharge, impotence, or testicular pain/masses. Musculoskeletal: No decreased ROM, myalgias, stiffness, joint swelling, or weakness. Skin: No rash, erythema, lesion changes, pain, warmth, jaundice, or pruritis. Neurological: No headache, dizziness, syncope, seizures, tremors, memory loss, coordination problems, or paresthesias. Psychological: No anxiety, depression,  hallucinations, SI/HI. Endocrine: No fatigue, polydipsia, polyphagia, polyuria, or known diabetes. All other systems were reviewed and are otherwise negative.  Past Medical History:  Diagnosis Date  . Cardiomegaly 08/24/2014   [facility]  . Hematuria 08/24/2014   exercise induced- [facility]  . HSV-2 infection      Past Surgical History:  Procedure Laterality Date  . ANTERIOR CRUCIATE LIGAMENT REPAIR  07/1994  . KNEE ARTHROSCOPY Bilateral    Has had multiple knee surgeries in his Teens-20s    Home Meds:   Outpatient Medications Prior to Visit  Medication Sig Dispense Refill  . acyclovir (ZOVIRAX) 400 MG tablet TAKE 1 TABLET (400 MG TOTAL) BY MOUTH 3 (THREE) TIMES DAILY. FOR 5 DAYS 15 tablet 5  . simvastatin (ZOCOR) 10 MG tablet TAKE 1 TABLET BY MOUTH AT BEDTIME 90 tablet 0  . zolpidem (AMBIEN) 10 MG tablet TAKE 1 TABLET AT BEDTIME AS NEEDED 30 tablet 0  . simvastatin (ZOCOR) 10 MG tablet Take 1 tablet (10 mg total) by mouth at bedtime. 90 tablet 1   No facility-administered medications prior to visit.      Allergies: No Known Allergies  Social History   Socioeconomic History  . Marital status: Married    Spouse name: Not on file  . Number of children: Not on file  . Years of education: Not on file  . Highest education level: Not on file  Occupational History  . Not on file  Social Needs  . Financial resource strain: Not on file  . Food insecurity:    Worry: Not on file    Inability: Not on file  . Transportation needs:    Medical: Not on file    Non-medical: Not on file  Tobacco Use  . Smoking status: Never Smoker  . Smokeless tobacco: Never Used  Substance and Sexual Activity  . Alcohol use: Yes    Alcohol/week: 2.4 oz    Types: 4 Cans of beer per week  . Drug use: No  . Sexual activity: Yes  Lifestyle  . Physical activity:    Days per week: Not on file    Minutes per session: Not on file  . Stress: Not on file  Relationships  . Social  connections:    Talks on phone: Not on file    Gets together: Not on file    Attends religious service: Not on file    Active member of club or organization: Not on file    Attends meetings of clubs or organizations: Not on file    Relationship status: Not on file  . Intimate partner violence:    Fear of current or ex partner: Not on file    Emotionally abused: Not on file    Physically abused: Not on file    Forced sexual activity: Not on file  Other Topics Concern  . Not on file  Social History Narrative   Paramedic for [employer]   Married. 3 children--2 are step children  1 is his own biologic child: Ages 43, 70, 28.   Last year started exercising: Runs 3 miles / day 3-4 days / week.   Also does push-ups, sit ups etc at home.    THIS  INFO  ENTERED  06/22/2013    Family History  Problem Relation Age of Onset  . Hyperlipidemia Father   . Hypertension Father   . Heart disease Father 38       MI--stent  . Stroke Paternal Grandfather 73       Stroke     Physical Exam: Blood pressure 130/82, pulse 63, temperature 97.6 F (36.4 C), temperature source Oral, resp. rate 16, height 5' 10.25" (1.784 m), weight 88.9 kg (196 lb), SpO2 97 %., Body mass index is 27.92 kg/m. General: WNWD Muscular WM. Appears in no acute distress. Head: Normocephalic, atraumatic, eyes without discharge, sclera non-icteric, nares are without discharge. Bilateral auditory canals clear, TM's are without perforation, pearly grey and translucent with reflective cone of light bilaterally. Oral cavity moist, posterior pharynx without exudate, erythema.  Neck: Supple. No thyromegaly. No lymphadenopathy. No palpable mass. Lungs: Clear bilaterally to auscultation without wheezes, rales, or rhonchi. Breathing is unlabored. Heart: RRR with S1 S2. No murmurs, rubs, or gallops. Abdomen: Soft, non-tender, non-distended with normoactive bowel sounds. No hepatomegaly. No rebound/guarding. No obvious abdominal  masses. Musculoskeletal:  Strength and tone normal for age. Extremities/Skin: Warm and dry.  No edema. No rashes or suspicious lesions. Neuro: Alert and oriented X 3. Moves all extremities spontaneously. Gait is normal. CNII-XII grossly in tact. Psych:  Responds to questions appropriately with a normal affect.     Assessment/Plan:  42 y.o. y/o  male here for CPE  -1. Visit for preventive health examination  A. Screening Labs:  He is fasting. - CBC with Differential - COMPLETE METABOLIC PANEL WITH GFR - Lipid panel   B. Immunizations:  Patient says he had a tetanus vaccine -- is up-to-date   2. Family history of coronary artery disease Father with history of MI at age 25. Patient does note that his father was a smoker. Paternal grandfather with history of stroke and death in his 1s. Patient also as this grandfather was also a smoker. Patient is on Simvastatin. Recheck FLP/ LFT to monitor   3. Hyperlipidemia, unspecified hyperlipidemia type See note above regarding family history. Patient is on Simvastatin. Recheck FLP/ LFT to monitor - COMPLETE METABOLIC PANEL WITH GFR - Lipid panel - simvastatin (ZOCOR) 10 MG tablet; Take 1 tablet (10 mg total) by mouth at bedtime.  Dispense: 90 tablet; Refill: 2  4. HSV-2 infection - acyclovir (ZOVIRAX) 400 MG tablet; Take 1 tablet (400 mg total) by mouth 3 (three) times daily. For 5 days--as needed for flares.   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  He uses Ambien as needed and this works well. - zolpidem (AMBIEN) 10 MG tablet; Take 1 tablet (10 mg total) by mouth at bedtime as needed.  Dispense: 30 tablet; Refill: 5  6. Dysphagia He reports that sometimes he has difficulty swallowing meats such as chicken and steak.  However he also reports that he has a bad habit of eating very quickly. He is going to be mindful with his eating and eat slower and make sure that he chews up his  food good.  However if dysphasia does persist he will call me and I will put in referral to GI for possible endoscopy to evaluate for possible esophageal gnosis/stricture.  Will Call patient with results of labs. If these are normal then otherwise we'll plan for follow up in one year or sooner if needed.   Signed:   [address] [place], [place]  10/15/2017 8:44 AM

## 2017-10-24 IMAGING — DX DG KNEE COMPLETE 4+V*R*
4 series · 4 of 4 positions shown · non-contrast
Comparison: None.

CLINICAL DATA: Twisted knee stepping out of truck with pain,
initial encounter

EXAM:
RIGHT KNEE - COMPLETE 4+ VIEW

[knee ap]
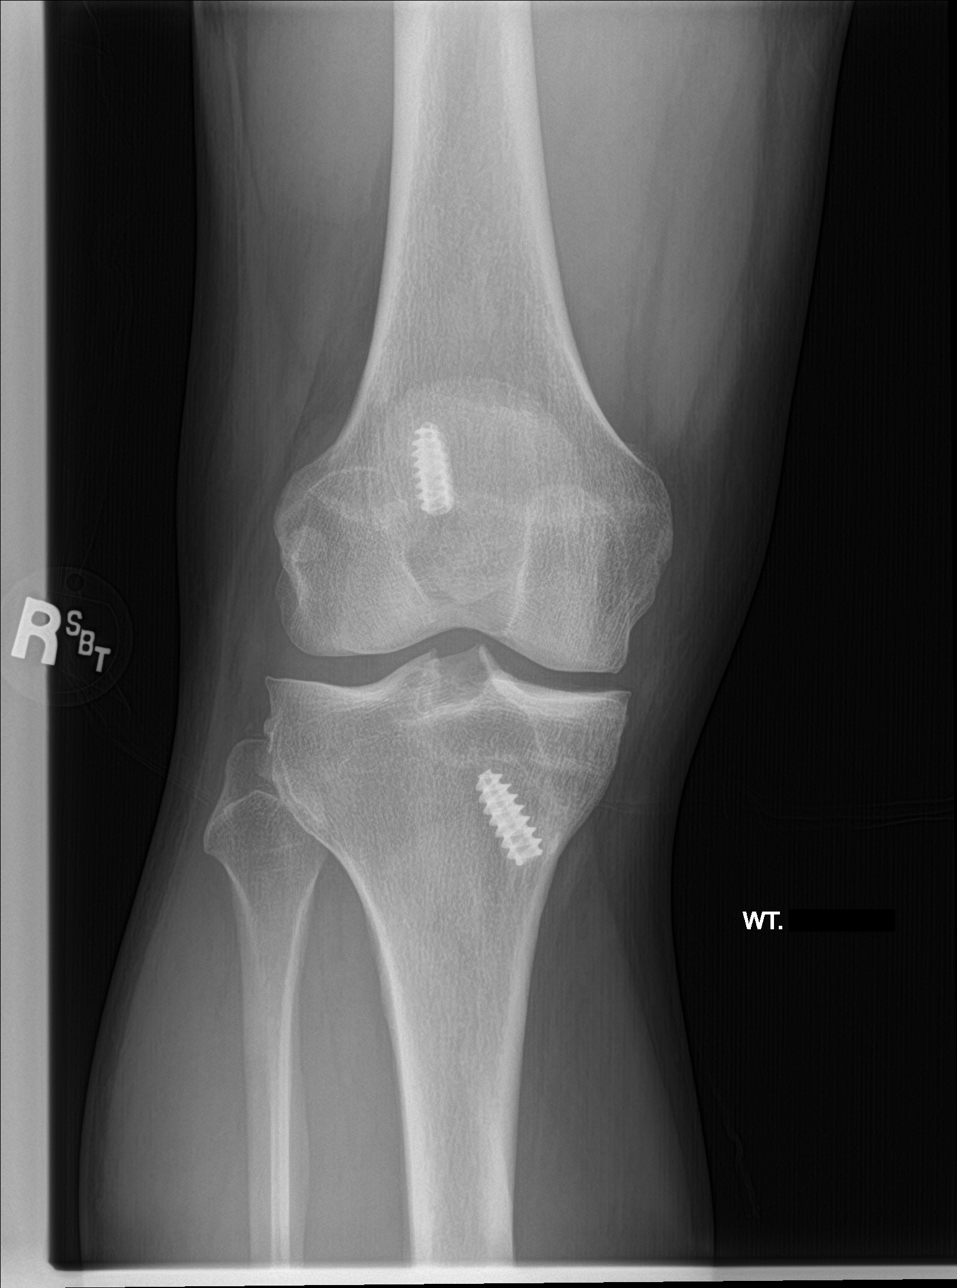

[tunnel]
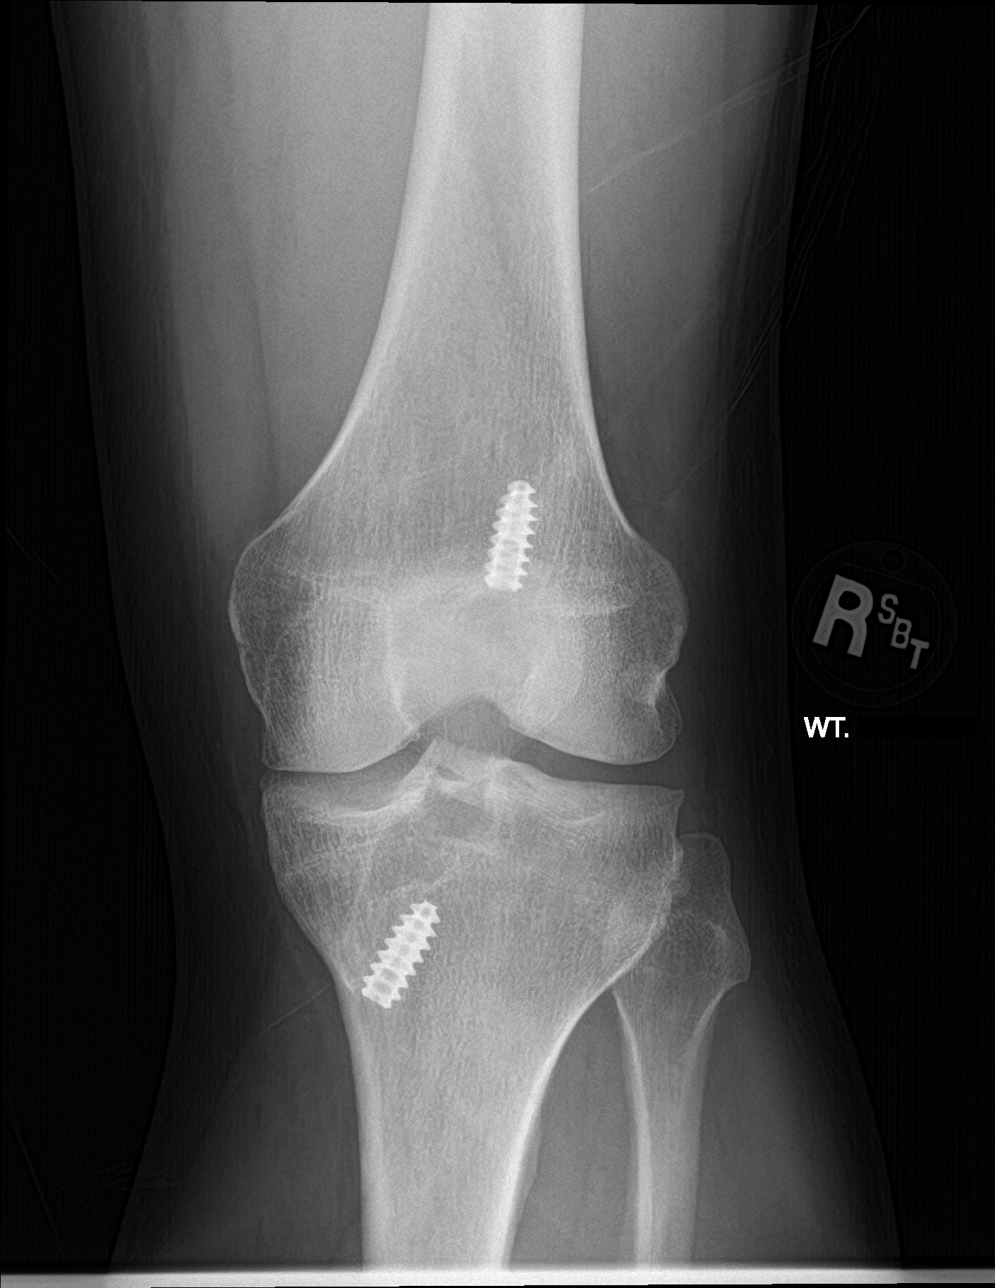

[knee lat]
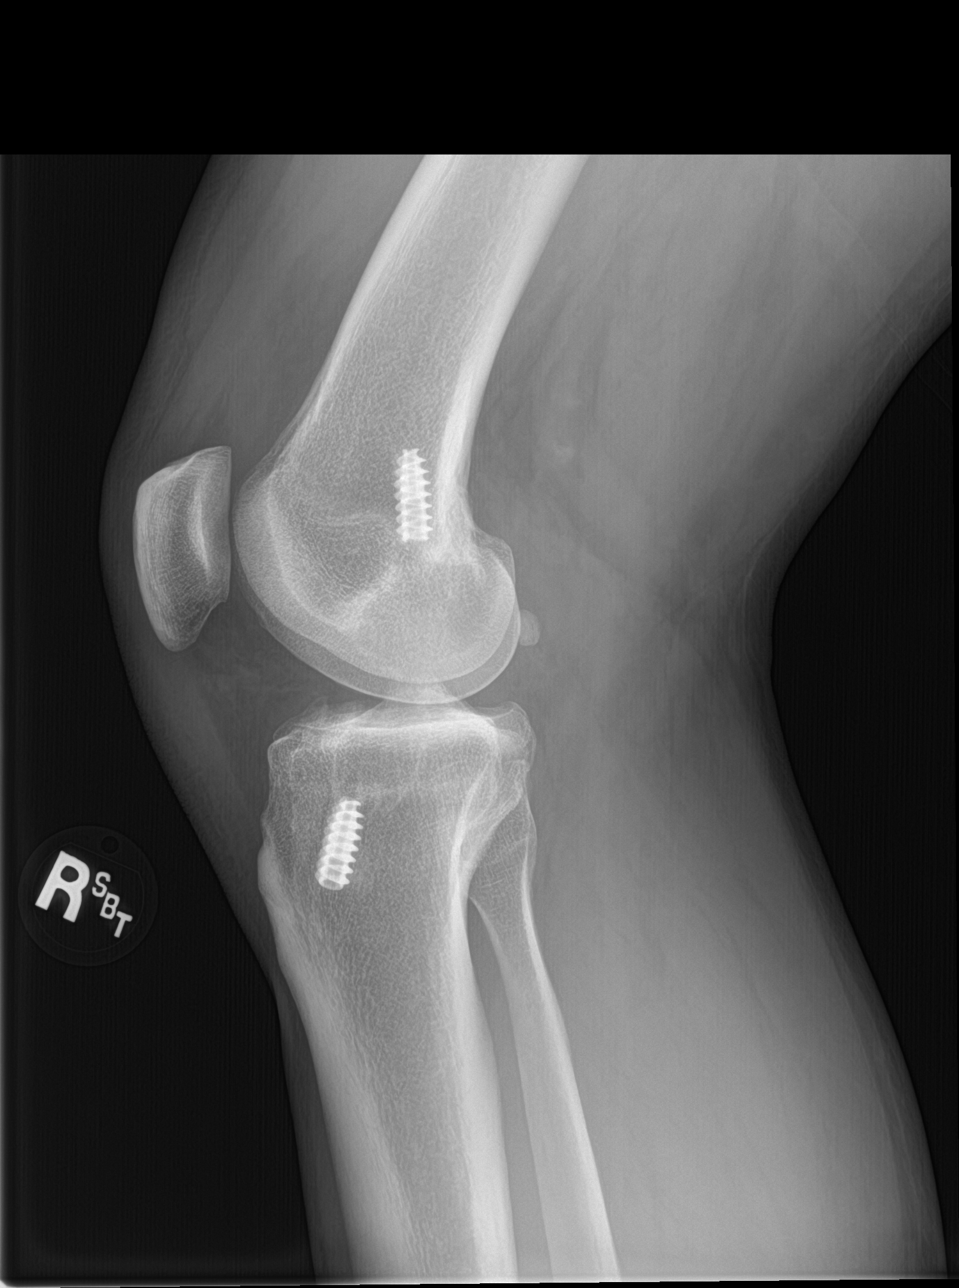

[knee sunrise]
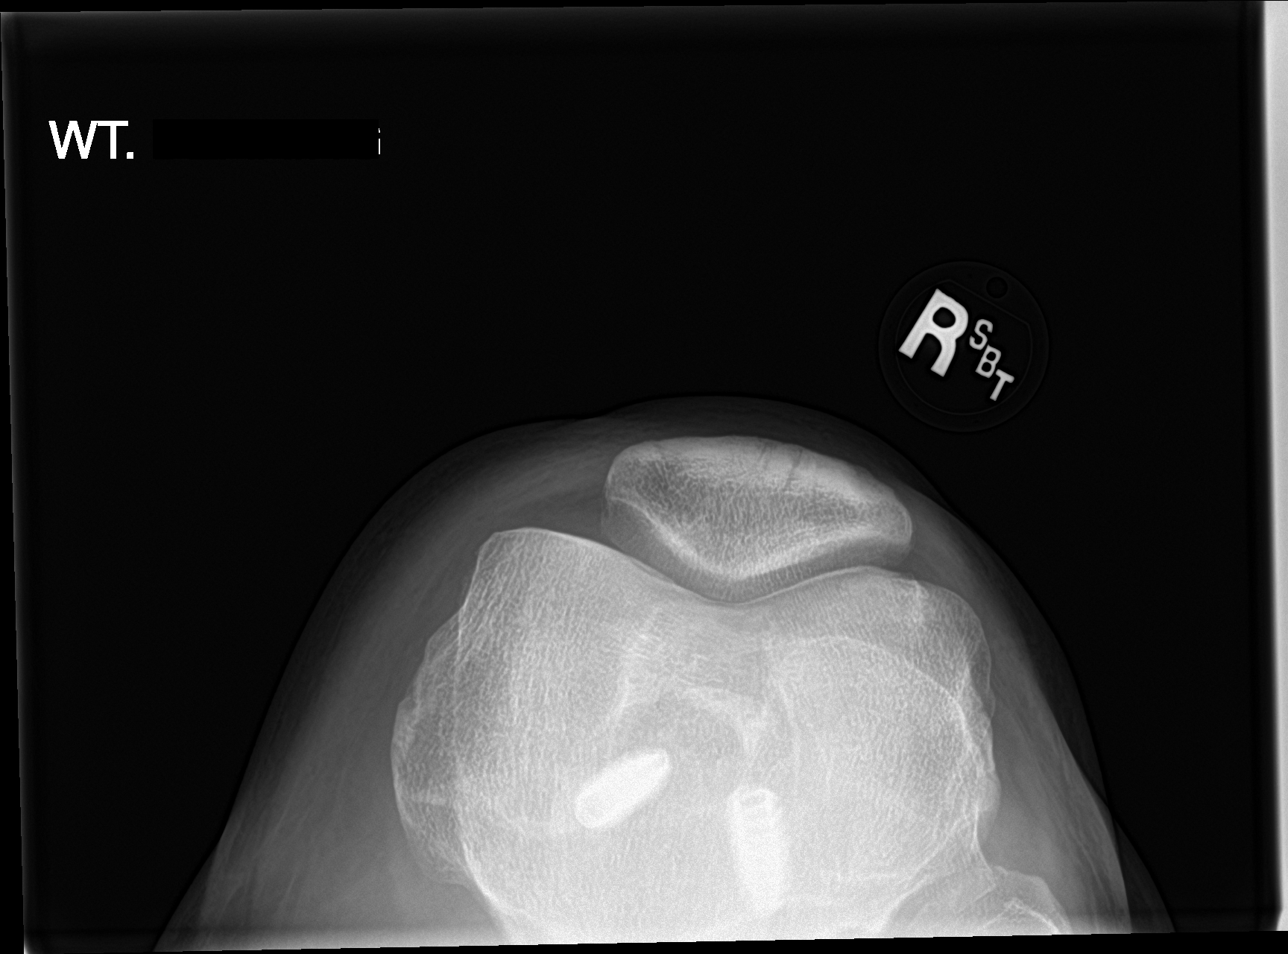

[4 of 4 positions shown; findings below may reference images not displayed]

FINDINGS: There are changes consistent with prior ACL reconstruction. No acute
fracture or dislocation is identified. No gross soft tissue
abnormality is seen.
IMPRESSION: No acute abnormality noted.

## 2018-01-12 DIAGNOSIS — M9901 Segmental and somatic dysfunction of cervical region: Secondary | ICD-10-CM | POA: Diagnosis not present

## 2018-01-12 DIAGNOSIS — M6240 Contracture of muscle, unspecified site: Secondary | ICD-10-CM | POA: Diagnosis not present

## 2018-01-12 DIAGNOSIS — M9902 Segmental and somatic dysfunction of thoracic region: Secondary | ICD-10-CM | POA: Diagnosis not present

## 2018-01-20 DIAGNOSIS — M6240 Contracture of muscle, unspecified site: Secondary | ICD-10-CM | POA: Diagnosis not present

## 2018-01-20 DIAGNOSIS — M9902 Segmental and somatic dysfunction of thoracic region: Secondary | ICD-10-CM | POA: Diagnosis not present

## 2018-01-20 DIAGNOSIS — M9901 Segmental and somatic dysfunction of cervical region: Secondary | ICD-10-CM | POA: Diagnosis not present

## 2018-04-05 DIAGNOSIS — M9902 Segmental and somatic dysfunction of thoracic region: Secondary | ICD-10-CM | POA: Diagnosis not present

## 2018-04-05 DIAGNOSIS — M6240 Contracture of muscle, unspecified site: Secondary | ICD-10-CM | POA: Diagnosis not present

## 2018-04-05 DIAGNOSIS — M9901 Segmental and somatic dysfunction of cervical region: Secondary | ICD-10-CM | POA: Diagnosis not present

## 2018-04-23 ENCOUNTER — Ambulatory Visit: Payer: 59 | Admitting: Family Medicine

## 2018-04-23 VITALS — BP 132/98 | HR 92 | Temp 98.4°F | Resp 16 | Ht 70.5 in | Wt 198.0 lb

## 2018-04-23 DIAGNOSIS — G4726 Circadian rhythm sleep disorder, shift work type: Secondary | ICD-10-CM | POA: Diagnosis not present

## 2018-04-23 DIAGNOSIS — R1310 Dysphagia, unspecified: Secondary | ICD-10-CM

## 2018-04-23 DIAGNOSIS — I10 Essential (primary) hypertension: Secondary | ICD-10-CM | POA: Diagnosis not present

## 2018-04-23 LAB — COMPLETE METABOLIC PANEL WITH GFR
AG Ratio: 2 (calc) (ref 1.0–2.5)
ALBUMIN MSPROF: 5.1 g/dL (ref 3.6–5.1)
ALKALINE PHOSPHATASE (APISO): 35 U/L — AB (ref 40–115)
ALT: 24 U/L (ref 9–46)
AST: 19 U/L (ref 10–40)
BUN: 16 mg/dL (ref 7–25)
CO2: 28 mmol/L (ref 20–32)
CREATININE: 1.07 mg/dL (ref 0.60–1.35)
Calcium: 10.1 mg/dL (ref 8.6–10.3)
Chloride: 104 mmol/L (ref 98–110)
GFR, EST AFRICAN AMERICAN: 98 mL/min/{1.73_m2} (ref >=60)
GFR, Est Non African American: 85 mL/min/{1.73_m2} (ref >=60)
GLUCOSE: 83 mg/dL (ref 65–99)
Globulin: 2.6 g/dL (calc) (ref 1.9–3.7)
Potassium: 4.5 mmol/L (ref 3.5–5.3)
Sodium: 140 mmol/L (ref 135–146)
TOTAL PROTEIN: 7.7 g/dL (ref 6.1–8.1)
Total Bilirubin: 0.6 mg/dL (ref 0.2–1.2)

## 2018-04-23 LAB — LIPID PANEL
CHOL/HDL RATIO: 3.5 (calc) (ref <5.0)
CHOLESTEROL: 170 mg/dL (ref <200)
HDL: 48 mg/dL (ref >40)
LDL Cholesterol (Calc): 108 mg/dL (calc) — ABNORMAL HIGH
Non-HDL Cholesterol (Calc): 122 mg/dL (calc) (ref <130)
TRIGLYCERIDES: 47 mg/dL (ref <150)

## 2018-04-23 MED ORDER — ZOLPIDEM TARTRATE 10 MG PO TABS: 10.0000 mg | ORAL_TABLET | Freq: Every evening | ORAL | 5 refills | Status: DC | PRN

## 2018-04-23 MED ORDER — AMLODIPINE BESYLATE 5 MG PO TABS: 5.0000 mg | ORAL_TABLET | Freq: Every day | ORAL | 3 refills | Status: DC

## 2018-04-23 NOTE — Progress Notes (Signed)
Patient ID: Kevin Paul, male    DOB: [birth year], 43 y.o.   MRN: [id number]  PCP: Delsa Grana, PA-C  Chief Complaint  Patient presents with  . Dysphagia    Patient states he has been having spasm causing him to get choked. Onset several years ago  . Hypertension    Patient states bp is elevated at work 160/100. Work as paramedic     Subjective:   Kevin Paul is a 43 y.o. male, presents to clinic with CC of concern for elevated BP and 1.5 years dysphagia that seems to be worse - wanted to recheck.  His PCP recently left practice - wanted to also get established with new PCP in same office.   He states that he began to occasionally choke anpit 1.5 years ago -he did discuss this with his PA/PCP.  Described as food getting stuck, he points to central substernal area, states that foods were hard to swallow, usually meats would get stuck occasionally but eventually feel like it passed down into stomach, but now "gets totally stuck" and he has to force himself to vomit it up.  Usually larger bites, not well chewed.  If he chews more carefully it is not as bad.  No problem with soft foods, no dysphagia with liquids. No GERD hx.  No hiccups, no weight loss.  About a year or so ago, he noticed BP high, had some HA's pressure in the back hof his head.  160/100-140/96.  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  He is concerned because he is never had elevated blood pressure before and because of family history of cardiovascular disease and early onset of events.  Paternal grandfather had stroke and died in his 30's, dad had heart disease 32's   For the past 1 to 2 weeks he's trying to exercise and cut back on ETOH Job is stressful -he notes that sometimes the stress can be the cause of elevated blood pressures and he has not checked it at home while relaxed or on days off. He's been exercising a lot more,  running this week.    He is not doing as much shift work mostly daytime shifts and occasionally a night or swing shift.  He has been taking his medications for elevated cholesterol without any side effects.   He denies any chest pain, shortness of breath, lower extremity edema, near syncope, palpitations, orthopnea, PND, visual disturbances, facial droop, slurred speech, focal weakness or numbness  Patient Active Problem List   Diagnosis Date Noted  . 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 Hyperlipidemia 01/30/2014  . Family history of coronary artery disease 06/22/2013  . HSV-2 infection      Prior to Admission medications   Medication Sig Start Date End Date Taking? Authorizing Provider  simvastatin (ZOCOR) 10 MG tablet Take 1 tablet (10 mg total) by mouth at bedtime. 10/15/17  Yes Dena Billet B, PA-C  zolpidem (AMBIEN) 10 MG tablet Take 1 tablet (10 mg total) by mouth at bedtime as needed. 10/15/17  Yes Orlena Sheldon, PA-C  acyclovir (ZOVIRAX) 400 MG tablet TAKE 1 TABLET (400 MG TOTAL) BY MOUTH 3 (THREE) TIMES DAILY. FOR 5 DAYS Patient not taking: Reported on 04/23/2018 10/15/17   Dena Billet B, PA-C     No Known Allergies   Family History  Problem Relation Age of Onset  . Hyperlipidemia Father   .  Hypertension Father   . Heart disease Father 43       MI--stent  . Stroke Paternal Grandfather 86       Stroke     Social History   Socioeconomic History  . Marital status: Married    Spouse name: Not on file  . Number of children: Not on file  . Years of education: Not on file  . Highest education level: Not on file  Occupational History  . Not on file  Social Needs  . Financial resource strain: Not on file  . Food insecurity:    Worry: Not on file    Inability: Not on file  . Transportation needs:    Medical: Not on file    Non-medical: Not on file  Tobacco Use  . Smoking status: Never Smoker  . Smokeless tobacco: Never Used  Substance and Sexual Activity  .  Alcohol use: Yes    Alcohol/week: 4.0 standard drinks    Types: 4 Cans of beer per week  . Drug use: No  . Sexual activity: Yes  Lifestyle  . Physical activity:    Days per week: Not on file    Minutes per session: Not on file  . Stress: Not on file  Relationships  . Social connections:    Talks on phone: Not on file    Gets together: Not on file    Attends religious service: Not on file    Active member of club or organization: Not on file    Attends meetings of clubs or organizations: Not on file    Relationship status: Not on file  . Intimate partner violence:    Fear of current or ex partner: Not on file    Emotionally abused: Not on file    Physically abused: Not on file    Forced sexual activity: Not on file  Other Topics Concern  . Not on file  Social History Narrative   Paramedic for [employer]   Married. 3 children--2 are step children  1 is his own biologic child: Ages 15, 5, 32.   Last year started exercising: Runs 3 miles / day 3-4 days / week.   Also does push-ups, sit ups etc at home.    THIS  INFO  ENTERED  06/22/2013     Review of Systems  Constitutional: Negative.   HENT: Negative.   Eyes: Negative.   Respiratory: Negative.   Cardiovascular: Negative.   Gastrointestinal: Negative.   Endocrine: Negative.   Genitourinary: Negative.   Musculoskeletal: Negative.   Skin: Negative.   Allergic/Immunologic: Negative.   Neurological: Negative.   Hematological: Negative.   Psychiatric/Behavioral: Negative.   All other systems reviewed and are negative.      Objective:    Vitals:   04/23/18 1003  BP: (!) 132/98  Pulse: 92  Resp: 16  Temp: 98.4 F (36.9 C)  TempSrc: Oral  SpO2: 98%  Weight: 198 lb (89.8 kg)  Height: 5' 10.5" (1.791 m)      Physical Exam Vitals signs and nursing note reviewed.  Constitutional:      General: He is not in acute distress.    Appearance: Normal appearance. He is well-developed. He is not ill-appearing,  toxic-appearing or diaphoretic.  HENT:     Head: Normocephalic and atraumatic.     Right Ear: There is no impacted cerumen.     Left Ear: There is no impacted cerumen.     Nose: Congestion present. No rhinorrhea.  Mouth/Throat:     Mouth: Mucous membranes are moist.     Pharynx: Oropharynx is clear.  Eyes:     General:        Right eye: No discharge.        Left eye: No discharge.     Conjunctiva/sclera: Conjunctivae normal.     Pupils: Pupils are equal, round, and reactive to light.  Neck:     Musculoskeletal: Normal range of motion and neck supple.     Trachea: No tracheal deviation.  Cardiovascular:     Rate and Rhythm: Normal rate and regular rhythm.     Pulses: Normal pulses.          Dorsalis pedis pulses are 2+ on the right side and 2+ on the left side.       Posterior tibial pulses are 2+ on the right side and 2+ on the left side.     Heart sounds: Normal heart sounds. No murmur. No friction rub. No gallop.   Pulmonary:     Effort: Pulmonary effort is normal. No respiratory distress.     Breath sounds: Normal breath sounds. No stridor. No wheezing, rhonchi or rales.  Chest:     Chest wall: No tenderness.  Abdominal:     General: Bowel sounds are normal. There is no distension.     Palpations: Abdomen is soft.     Tenderness: There is no abdominal tenderness. There is no guarding.  Musculoskeletal: Normal range of motion.     Right lower leg: No edema.     Left lower leg: No edema.  Skin:    General: Skin is warm and dry.     Capillary Refill: Capillary refill takes less than 2 seconds.     Coloration: Skin is not pale.     Findings: No rash.     Nails: There is no clubbing.   Neurological:     General: No focal deficit present.     Mental Status: He is alert.     Cranial Nerves: No cranial nerve deficit.     Sensory: No sensory deficit.     Motor: No weakness, tremor or abnormal muscle tone.     Coordination: Coordination normal.     Gait: Gait normal.      Deep Tendon Reflexes: Reflexes normal.  Psychiatric:        Mood and Affect: Mood normal.        Behavior: Behavior normal.           Assessment & Plan:      ICD-10-CM   1. Essential hypertension R42 COMPLETE METABOLIC PANEL WITH GFR    Lipid Panel  2. Dysphagia, unspecified type R13.10 Ambulatory referral to Gastroenterology  3. 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  Did prescribe Norvasc, will check patient's basic labs, he was encouraged to continue to monitor -  to do more BP readings at home while rested and relaxed.  Continue to work on running, limiting ETOH, diet - DASH and cholesterol lifestyle changes discussed and handout given.    For dysphasia of large solids particularly meat, will refer patient to gastroenterology because of its worsening symptoms over more than a year, encouraged to take smaller bites, chew longer.  Refer to gastroenterology -may be stricture, web, achalasia, -he may need  esophageal dilatation and GI evaluation would be possibly diagnostic and therapeutic.  No history of GERD no weight loss less likely to be mass or malignancy.  Recheck BP in 2-4 weeks. To return with BP log  Refill on Ambien - although he doesn't use regularly.     Delsa Grana, PA-C 04/23/18 10:11 AM

## 2018-04-27 ENCOUNTER — Encounter: Payer: Self-pay | Admitting: Family Medicine

## 2018-04-29 ENCOUNTER — Ambulatory Visit: Payer: 59 | Admitting: Gastroenterology

## 2018-04-29 ENCOUNTER — Encounter: Payer: Self-pay | Admitting: Gastroenterology

## 2018-04-29 VITALS — BP 118/88 | HR 56 | Ht 70.0 in | Wt 200.0 lb

## 2018-04-29 DIAGNOSIS — Z8719 Personal history of other diseases of the digestive system: Secondary | ICD-10-CM | POA: Diagnosis not present

## 2018-04-29 DIAGNOSIS — R1310 Dysphagia, unspecified: Secondary | ICD-10-CM | POA: Diagnosis not present

## 2018-04-29 DIAGNOSIS — R1319 Other dysphagia: Secondary | ICD-10-CM

## 2018-04-29 MED ORDER — OMEPRAZOLE 40 MG PO CPDR: 40.0000 mg | DELAYED_RELEASE_CAPSULE | Freq: Two times a day (BID) | ORAL | 2 refills | Status: DC

## 2018-04-29 NOTE — Patient Instructions (Signed)
It has been recommended to you by your physician that you have a(n) Endo/LEC completed. Per your request, we did not schedule the procedure(s) today. Please contact our office at [phone] should you decide to have the procedure completed.  If you are age 44 or older, your body mass index should be between 23-30. Your Body mass index is 28.7 kg/m. If this is out of the aforementioned range listed, please consider follow up with your Primary Care Provider.  If you are age 94 or younger, your body mass index should be between 19-25. Your Body mass index is 28.7 kg/m. If this is out of the aformentioned range listed, please consider follow up with your Primary Care Provider.    We have sent the following medications to your pharmacy for you to pick up at your convenience:  Omeprazole 53m twice daily.   Thank you for choosing [facility] and [facility].  Dr. MRush Landmark

## 2018-04-29 NOTE — Progress Notes (Signed)
[place] VISIT   Primary Care Provider Delsa Grana, PA-C [place] [address] [phone]  Referring Provider Delsa Grana, Hershal Coria [address] [place], [place] [address] [phone]  Patient Profile: Kevin Paul is a 43 y.o. male without a significant past medical history.  The patient presents to the [facility] for an evaluation and management of problem(s) noted below:  Problem List 1. Esophageal dysphagia   2. History of gastroesophageal reflux (GERD)     History of Present Illness:  This is the patient's first visit to the outpatient of our GI clinic.  He is a very healthy individual and a paramedic.  In the past he had a history of GERD which she has treated with Tums every so often but it is not a daily occurrence and has not been an issue recently for him.  He recounts that over the course the last 1 to 1.5 years he has had recurrent episodes of esophageal dysphagia and near food impactions that he has had to regurgitate food at times.  This only occurs when he is eating a protein.  He describes no liquid dysphagia.  Because the episodes have become more frequent he and his wife talked about it and decided get further evaluation.  His weight has been stable.  He has history of pediatric asthma rule out of that as a child and has not had any issues and does not have a history of atopic dermatitis or eczema the patient does not take frequent nonsteroidals or BC/Goody powders.  He is never had an upper or lower endoscopy.  There is no family history of esophageal issues.  GI Review of Systems Positive as above Negative for odynophagia, globus, abdominal pain, nausea, vomiting, jaundice, change in bowel habits, melena, hematochezia  Review of Systems General: Denies fevers/chills/weight loss HEENT: Denies oral lesions Cardiovascular: Denies chest pain/palpitations Pulmonary: Denies shortness of  breath/cough Gastroenterological: See HPI Genitourinary: Previous history of hematuria but denies darkened urine Hematological: Denies easy bruising/bleeding Dermatological: Denies jaundice Psychological: Mood is stable   Medications Current Outpatient Medications  Medication Sig Dispense Refill  . acyclovir (ZOVIRAX) 400 MG tablet TAKE 1 TABLET (400 MG TOTAL) BY MOUTH 3 (THREE) TIMES DAILY. FOR 5 DAYS 15 tablet 5  . amLODipine (NORVASC) 5 MG tablet Take 1 tablet (5 mg total) by mouth daily. 90 tablet 3  . simvastatin (ZOCOR) 10 MG tablet Take 1 tablet (10 mg total) by mouth at bedtime. 90 tablet 2  . zolpidem (AMBIEN) 10 MG tablet Take 1 tablet (10 mg total) by mouth at bedtime as needed. 30 tablet 5  . omeprazole (PRILOSEC) 40 MG capsule Take 1 capsule (40 mg total) by mouth 2 (two) times daily. 60 capsule 2   No current facility-administered medications for this visit.     Allergies No Known Allergies  Histories Past Medical History:  Diagnosis Date  . Cardiomegaly 08/24/2014   [facility]  . Hematuria 08/24/2014   exercise induced- [facility]  . HSV-2 infection    Past Surgical History:  Procedure Laterality Date  . ANTERIOR CRUCIATE LIGAMENT REPAIR  07/1994  . KNEE ARTHROSCOPY Bilateral    Has had multiple knee surgeries in his Teens-20s   Social History   Socioeconomic History  . Marital status: Married    Spouse name: Not on file  . Number of children: Not on file  . Years of education: Not on file  . Highest education level: Not  on file  Occupational History  . Not on file  Social Needs  . Financial resource strain: Not on file  . Food insecurity:    Worry: Not on file    Inability: Not on file  . Transportation needs:    Medical: Not on file    Non-medical: Not on file  Tobacco Use  . Smoking status: Never Smoker  . Smokeless tobacco: Never Used  Substance and Sexual Activity  . Alcohol use: Yes    Alcohol/week: 4.0 standard drinks     Types: 4 Cans of beer per week  . Drug use: No  . Sexual activity: Yes  Lifestyle  . Physical activity:    Days per week: Not on file    Minutes per session: Not on file  . Stress: Not on file  Relationships  . Social connections:    Talks on phone: Not on file    Gets together: Not on file    Attends religious service: Not on file    Active member of club or organization: Not on file    Attends meetings of clubs or organizations: Not on file    Relationship status: Not on file  . Intimate partner violence:    Fear of current or ex partner: Not on file    Emotionally abused: Not on file    Physically abused: Not on file    Forced sexual activity: Not on file  Other Topics Concern  . 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   Last year started exercising: Runs 3 miles / day 3-4 days / week.   Also does push-ups, sit ups etc at home.    THIS  INFO  ENTERED  06/22/2013   Family History  Problem Relation Age of Onset  . Hyperlipidemia Father   . Hypertension Father   . Heart disease Father 49       MI--stent  . Stroke Paternal Grandfather 26       Stroke  . Colon polyps Neg Hx   . Esophageal cancer Neg Hx   . Inflammatory bowel disease Neg Hx   . Liver disease Neg Hx   . Pancreatic cancer Neg Hx   . Rectal cancer Neg Hx   . Stomach cancer Neg Hx    I have reviewed his medical, social, and family history in detail and updated the electronic medical record as necessary.    PHYSICAL EXAMINATION  BP 118/88   Pulse (!) 56   Ht 5' 10"  (1.778 m)   Wt 200 lb (90.7 kg)   BMI 28.70 kg/m  Wt Readings from Last 3 Encounters:  04/29/18 200 lb (90.7 kg)  04/23/18 198 lb (89.8 kg)  10/15/17 196 lb (88.9 kg)  GEN: NAD, appears stated age, doesn't appear chronically ill PSYCH: Cooperative, without pressured speech EYE: Conjunctivae pink, sclerae anicteric ENT:  MMM, without oral ulcers, no erythema or exudates noted NECK: Supple CV: RR without R/Gs  RESP: CTAB posteriorly, without wheezing GI: NABS, soft, NT/ND, without rebound or guarding, no HSM appreciated MSK/EXT: No lower extremity edema SKIN: No jaundice NEURO:  Alert & Oriented x 3, no focal deficits   REVIEW OF DATA  I reviewed the following data at the time of this encounter:  GI Procedures and Studies  No relevant studies to review  Laboratory Studies  Reviewed in epic  Imaging Studies  No relevant studies to review   ASSESSMENT  Mr. Bowden is a 43 y.o. male without a significant past medical history.   The patient is seen today for evaluation and management of:  1. Esophageal dysphagia   2. History of gastroesophageal reflux (GERD)    This is a hemodynamically and clinically stable patient.  The patient describes multiple episodes of what appear to be acute food impactions though he has not required endoscopic evaluation he has had to regurgitate his food.  As young as he is he is at risk obviously for GERD related peptic strictures as well as the possibility of eosinophilic or lymphocytic esophagitis.  Although very unlikely malignancy is always in the differential diagnosis as well.  A dysmotility could be occurring if all the above is worked up and negative.  We will initiate the patient on high-dose PPI therapy and work on scheduling an upper endoscopy in the coming weeks to evaluate and obtain biopsies and/or dilate if necessary.  The risks and benefits of endoscopic evaluation were discussed with the patient; these include but are not limited to the risk of perforation, infection, bleeding, missed lesions, lack of diagnosis, severe illness requiring hospitalization, as well as anesthesia and sedation related illnesses.  The patient is agreeable to proceed.  All patient questions were answered, to the best of my ability, and the patient agrees to the aforementioned plan of  action with follow-up as indicated.   PLAN  Initiate PPI 40 mg twice daily Schedule upper endoscopy with biopsies and/or dilation   No orders of the defined types were placed in this encounter.   New Prescriptions   OMEPRAZOLE (PRILOSEC) 40 MG CAPSULE    Take 1 capsule (40 mg total) by mouth 2 (two) times daily.   Modified Medications   No medications on file    Planned Follow Up: No follow-ups on file.   Justice Britain, MD [facility] Advanced Endoscopy Office # [phone]

## 2018-05-01 ENCOUNTER — Encounter: Payer: Self-pay | Admitting: Gastroenterology

## 2018-05-01 DIAGNOSIS — R1319 Other dysphagia: Secondary | ICD-10-CM | POA: Insufficient documentation

## 2018-05-01 DIAGNOSIS — R1310 Dysphagia, unspecified: Secondary | ICD-10-CM | POA: Insufficient documentation

## 2018-05-01 DIAGNOSIS — Z8719 Personal history of other diseases of the digestive system: Secondary | ICD-10-CM | POA: Insufficient documentation

## 2018-05-03 ENCOUNTER — Encounter: Payer: Self-pay | Admitting: Gastroenterology

## 2018-05-07 ENCOUNTER — Ambulatory Visit: Payer: 59 | Admitting: Family Medicine

## 2018-05-07 ENCOUNTER — Encounter: Payer: Self-pay | Admitting: Family Medicine

## 2018-05-07 VITALS — BP 120/82 | HR 61 | Temp 97.9°F | Resp 15 | Ht 70.5 in | Wt 198.0 lb

## 2018-05-07 DIAGNOSIS — E785 Hyperlipidemia, unspecified: Secondary | ICD-10-CM | POA: Diagnosis not present

## 2018-05-07 DIAGNOSIS — I10 Essential (primary) hypertension: Secondary | ICD-10-CM | POA: Diagnosis not present

## 2018-05-07 NOTE — Progress Notes (Signed)
Patient ID: Kevin Paul, male    DOB: 1975/09/03, 43 y.o.   MRN: [id number]  PCP: Delsa Grana, PA-C  Chief Complaint  Patient presents with  . Hypertension    Patient is in today for a follow up on BP    Subjective:   Kevin Paul is a 43 y.o. male, presents to clinic with CC of follow up on HTN after starting norvasc 5 mg.  His BP has been around 120/80 on average.  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  The first time he took norvasc he did so right before going to work out and it made him feel "a little weird" but he is taking it in the morning and other than that one instance, he denies any SE.  No lightheadedness, tachycardia, LE edema, exertional SOB/CP.   He's had no HA's.  He did go to see GI and he is scheduled for upper GI    Patient Active Problem List   Diagnosis Date Noted  . Esophageal dysphagia 05/01/2018  . History of gastroesophageal reflux (GERD) 05/01/2018  . 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 Hyperlipidemia 01/30/2014  . Family history of coronary artery disease 06/22/2013  . HSV-2 infection      Prior to Admission medications   Medication Sig Start Date End Date Taking? Authorizing Provider  amLODipine (NORVASC) 5 MG tablet Take 1 tablet (5 mg total) by mouth daily. 04/23/18  Yes Delsa Grana, PA-C  omeprazole (PRILOSEC) 40 MG capsule Take 1 capsule (40 mg total) by mouth 2 (two) times daily. 04/29/18  Yes Mansouraty, Telford Nab., MD  simvastatin (ZOCOR) 10 MG tablet Take 1 tablet (10 mg total) by mouth at bedtime. 10/15/17  Yes Dena Billet B, PA-C  zolpidem (AMBIEN) 10 MG tablet Take 1 tablet (10 mg total) by mouth at bedtime as needed. 04/23/18  Yes Delsa Grana, PA-C  acyclovir (ZOVIRAX) 400 MG tablet TAKE 1 TABLET (400 MG TOTAL) BY MOUTH 3 (THREE) TIMES DAILY. FOR 5 DAYS Patient not taking: Reported on 05/07/2018 10/15/17   Dena Billet B, PA-C     No Known Allergies   Family  History  Problem Relation Age of Onset  . Hyperlipidemia Father   . Hypertension Father   . Heart disease Father 82       MI--stent  . Stroke Paternal Grandfather 77       Stroke  . Colon polyps Neg Hx   . Esophageal cancer Neg Hx   . Inflammatory bowel disease Neg Hx   . Liver disease Neg Hx   . Pancreatic cancer Neg Hx   . Rectal cancer Neg Hx   . Stomach cancer Neg Hx      Social History   Socioeconomic History  . Marital status: Married    Spouse name: Not on file  . Number of children: Not on file  . Years of education: Not on file  . Highest education level: Not on file  Occupational History  . Not on file  Social Needs  . Financial resource strain: Not on file  . Food insecurity:    Worry: Not on file    Inability: Not on file  . Transportation needs:    Medical: Not on file    Non-medical: Not on file  Tobacco Use  . Smoking status: Never Smoker  . Smokeless tobacco: Never Used  Substance and Sexual Activity  .  Alcohol use: Yes    Alcohol/week: 4.0 standard drinks    Types: 4 Cans of beer per week  . Drug use: No  . Sexual activity: Yes  Lifestyle  . Physical activity:    Days per week: Not on file    Minutes per session: Not on file  . Stress: Not on file  Relationships  . Social connections:    Talks on phone: Not on file    Gets together: Not on file    Attends religious service: Not on file    Active member of club or organization: Not on file    Attends meetings of clubs or organizations: Not on file    Relationship status: Not on file  . Intimate partner violence:    Fear of current or ex partner: Not on file    Emotionally abused: Not on file    Physically abused: Not on file    Forced sexual activity: Not on file  Other Topics Concern  . 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 3 children--2 are step children  1 is his own biologic child: Ages 72, 76, 62.   Last year started exercising: Runs 3  miles / day 3-4 days / week.   Also does push-ups, sit ups etc at home.    THIS  INFO  ENTERED  06/22/2013     Review of Systems  Constitutional: Negative.   HENT: Negative.   Eyes: Negative.   Respiratory: Negative.   Cardiovascular: Negative.   Gastrointestinal: Negative.   Endocrine: Negative.   Genitourinary: Negative.   Musculoskeletal: Negative.   Skin: Negative.   Allergic/Immunologic: Negative.   Neurological: Negative.   Hematological: Negative.   Psychiatric/Behavioral: Negative.   All other systems reviewed and are negative.      Objective:    Vitals:   05/07/18 0921  BP: 120/82  Pulse: 61  Resp: 15  Temp: 97.9 F (36.6 C)  TempSrc: Oral  SpO2: 96%  Weight: 198 lb (89.8 kg)  Height: 5' 10.5" (1.791 m)      Physical Exam Vitals signs and nursing note reviewed.  Constitutional:      General: He is not in acute distress.    Appearance: Normal appearance. He is well-developed. He is not ill-appearing, toxic-appearing or diaphoretic.  HENT:     Head: Normocephalic and atraumatic.     Right Ear: External ear normal.     Left Ear: External ear normal.     Nose: Nose normal.  Eyes:     General:        Right eye: No discharge.        Left eye: No discharge.     Conjunctiva/sclera: Conjunctivae normal.  Neck:     Musculoskeletal: Normal range of motion and neck supple.     Trachea: No tracheal deviation.  Cardiovascular:     Rate and Rhythm: Normal rate and regular rhythm.     Pulses: Normal pulses.     Heart sounds: Normal heart sounds. No murmur. No gallop.   Pulmonary:     Effort: Pulmonary effort is normal. No respiratory distress.     Breath sounds: Normal breath sounds. No stridor.  Musculoskeletal: Normal range of motion.     Right lower leg: No edema.     Left lower leg: No edema.  Skin:    General: Skin is warm and dry.     Findings: No rash.  Neurological:  Mental Status: He is alert.     Motor: No abnormal muscle tone.      Coordination: Coordination normal.  Psychiatric:        Behavior: Behavior normal.           Assessment & Plan:   2 week recheck after starting norvasc for elevated BP.  Problem List Items Addressed This Visit      Cardiovascular and Mediastinum   Essential hypertension - Primary    Improved with norvasc 5 mg, no SE BP at goal today Continue with healthy lifestyle and norvasc F/up 6 months or sooner as needed        Other   Hyperlipidemia    On zocor 10 mg, no SE Recent labs show good cholesterol control On for dx of HLD and family hx of early CAD/MI Repeat labs FLP and CMP in 6 months               Delsa Grana, PA-C 05/07/18 9:39 AM

## 2018-05-12 ENCOUNTER — Encounter: Payer: Self-pay | Admitting: Family Medicine

## 2018-05-12 DIAGNOSIS — I10 Essential (primary) hypertension: Secondary | ICD-10-CM | POA: Insufficient documentation

## 2018-05-12 NOTE — Assessment & Plan Note (Signed)
Improved with norvasc 5 mg, no SE BP at goal today Continue with healthy lifestyle and norvasc F/up 6 months or sooner as needed

## 2018-05-12 NOTE — Assessment & Plan Note (Signed)
On zocor 10 mg, no SE Recent labs show good cholesterol control On for dx of HLD and family hx of early CAD/MI Repeat labs FLP and CMP in 6 months

## 2018-05-17 ENCOUNTER — Telehealth: Payer: Self-pay | Admitting: Gastroenterology

## 2018-05-17 NOTE — Telephone Encounter (Signed)
Pt called and wanted to speak to the nurse for advice. He is wanting to know if he can hold off on the EGD and just continue to take the medication that was prescribe to him. Or if the doctor strongly recommends for him to have the procedure.

## 2018-05-17 NOTE — Telephone Encounter (Signed)
Pt is cancelling appt.

## 2018-05-18 NOTE — Telephone Encounter (Signed)
The patient has been advised that the procedure is recommended and it is his choice whether or not he keeps the EGD appt.  He wants to cancel and the appt was cancelled at his request he will call back if he would like to reschedule.

## 2018-05-27 ENCOUNTER — Encounter: Payer: Self-pay | Admitting: Gastroenterology

## 2018-07-17 ENCOUNTER — Other Ambulatory Visit: Payer: Self-pay | Admitting: Gastroenterology

## 2018-08-11 ENCOUNTER — Other Ambulatory Visit: Payer: Self-pay | Admitting: Gastroenterology

## 2018-09-24 ENCOUNTER — Other Ambulatory Visit: Payer: Self-pay | Admitting: Gastroenterology

## 2018-09-24 ENCOUNTER — Other Ambulatory Visit: Payer: Self-pay | Admitting: Family Medicine

## 2018-09-24 DIAGNOSIS — E785 Hyperlipidemia, unspecified: Secondary | ICD-10-CM

## 2018-10-23 ENCOUNTER — Other Ambulatory Visit: Payer: Self-pay | Admitting: Gastroenterology

## 2018-10-26 ENCOUNTER — Other Ambulatory Visit: Payer: Self-pay | Admitting: Family Medicine

## 2018-10-26 DIAGNOSIS — G4726 Circadian rhythm sleep disorder, shift work type: Secondary | ICD-10-CM

## 2018-10-27 NOTE — Telephone Encounter (Signed)
Requesting refill   Ambien   LOV: 05/07/2018  LRF:  04/23/18

## 2018-12-26 ENCOUNTER — Other Ambulatory Visit: Payer: Self-pay | Admitting: Family Medicine

## 2018-12-26 DIAGNOSIS — G4726 Circadian rhythm sleep disorder, shift work type: Secondary | ICD-10-CM

## 2018-12-27 NOTE — Telephone Encounter (Signed)
Requesting refill    Ambien  LOV: 05/07/2018  LRF:  10/28/2018

## 2019-04-09 ENCOUNTER — Other Ambulatory Visit: Payer: Self-pay | Admitting: Family Medicine

## 2019-04-09 DIAGNOSIS — E785 Hyperlipidemia, unspecified: Secondary | ICD-10-CM

## 2019-04-12 NOTE — Telephone Encounter (Signed)
lvm to sch appt before anymore refills can be given per dr

## 2019-04-25 ENCOUNTER — Other Ambulatory Visit: Payer: Self-pay | Admitting: Family Medicine

## 2019-05-02 ENCOUNTER — Ambulatory Visit (INDEPENDENT_AMBULATORY_CARE_PROVIDER_SITE_OTHER): Payer: 59 | Admitting: Nurse Practitioner

## 2019-05-02 ENCOUNTER — Other Ambulatory Visit: Payer: Self-pay

## 2019-05-02 ENCOUNTER — Encounter: Payer: Self-pay | Admitting: Nurse Practitioner

## 2019-05-02 VITALS — BP 130/82 | HR 80 | Temp 98.1°F | Resp 16 | Ht 70.5 in | Wt 197.5 lb

## 2019-05-02 DIAGNOSIS — E785 Hyperlipidemia, unspecified: Secondary | ICD-10-CM | POA: Diagnosis not present

## 2019-05-02 DIAGNOSIS — G4700 Insomnia, unspecified: Secondary | ICD-10-CM

## 2019-05-02 DIAGNOSIS — G4726 Circadian rhythm sleep disorder, shift work type: Secondary | ICD-10-CM

## 2019-05-02 DIAGNOSIS — F520 Hypoactive sexual desire disorder: Secondary | ICD-10-CM

## 2019-05-02 DIAGNOSIS — I10 Essential (primary) hypertension: Secondary | ICD-10-CM | POA: Diagnosis not present

## 2019-05-02 DIAGNOSIS — Z8249 Family history of ischemic heart disease and other diseases of the circulatory system: Secondary | ICD-10-CM

## 2019-05-02 DIAGNOSIS — Z0000 Encounter for general adult medical examination without abnormal findings: Secondary | ICD-10-CM

## 2019-05-02 DIAGNOSIS — B009 Herpesviral infection, unspecified: Secondary | ICD-10-CM

## 2019-05-02 MED ORDER — ZOLPIDEM TARTRATE 5 MG PO TABS: ORAL_TABLET | ORAL | 3 refills | Status: DC

## 2019-05-02 MED ORDER — ACYCLOVIR 400 MG PO TABS: ORAL_TABLET | ORAL | 5 refills | Status: AC

## 2019-05-02 NOTE — Patient Instructions (Addendum)
Follow up in 1 year for annual wellness examination.  Office will call you with today's abnormal lab results, you may view the labs with mychart.  Take your blood pressure at home 2-3 hours after taking blood pressure medications.  You may take Ambien only as needed after taking melatonin for your difficulty sleeping with night shift changes.  Drink plenty of water, eat a healthy low carb, low cholesterol, heart healthy diet, get exercise at least 4 times per week for 20 minutes.  Take melatonin for difficulty sleeping and then if continues that night may take as needed Ambien.

## 2019-05-02 NOTE — Progress Notes (Addendum)
Established Patient Office Visit  Subjective:  Patient ID: Kevin Paul, male    DOB: Feb 19, 1976  Age: 44 y.o. MRN: [id number]  CC:  Chief Complaint  Patient presents with  . Annual Exam    HPI Kevin Paul is a 44 y.o caucasian male presenting for CPE. 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 He has failed melatonin in the past has used Ambien and would like refill. Time spent discussing limited use and effects of long term use. Pt would like to have testosterone levels checked today r/t decreased sexual desire. Time spent discussing blood pressure home monitoring with directions, diet and exercise r/t his family and personal history. Pt would like refill on Acyclovir in case he needs fill but denied HSVII active at this time or recently. Pt reports + COVID in December with what he feels is occasional headaches that are mild. He has reported received Madrema COVID vaccination completion. He has been fasting and prepared for lab draw today. He reported Flu vaccine at work 12/2018. Denied CP/CT, GU/GI sxs, pains, SOB, palpitation, back pain, left arm pain, toothache, or falls.   Past Medical History:  Diagnosis Date  . Cardiomegaly 08/24/2014   [facility]  . Hematuria 08/24/2014   exercise induced- [facility]  . HSV-2 infection     Past Surgical History:  Procedure Laterality Date  . ANTERIOR CRUCIATE LIGAMENT REPAIR  07/1994  . KNEE ARTHROSCOPY Bilateral    Has had multiple knee surgeries in his Teens-20s    Family History  Problem Relation Age of Onset  . Hyperlipidemia Father   . Hypertension Father   . Heart disease Father 87       MI--stent  . Stroke Paternal Grandfather 76       Stroke  . Colon polyps Neg Hx   . Esophageal cancer Neg Hx   . Inflammatory bowel disease Neg Hx   . Liver disease Neg Hx   . Pancreatic cancer Neg Hx   . Rectal cancer Neg Hx   . Stomach cancer Neg Hx     Social History    Socioeconomic History  . Marital status: Married    Spouse name: Not on file  . Number of children: Not on file  . Years of education: Not on file  . Highest education level: Not on file  Occupational History  . Not on file  Tobacco Use  . Smoking status: Never Smoker  . Smokeless tobacco: Never Used  Substance and Sexual Activity  . Alcohol use: Yes    Alcohol/week: 4.0 standard drinks    Types: 4 Cans of beer per week  . Drug use: No  . Sexual activity: Yes  Other Topics Concern  . 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   Last year started exercising: Runs 3 miles / day 3-4 days / week.   Also does push-ups, sit ups etc at home.    THIS  INFO  ENTERED  06/22/2013   Social Determinants of Health   Financial Resource Strain:   . Difficulty of Paying Living Expenses: Not on file  Food Insecurity:   . Worried About Charity fundraiser in the Last Year: Not on file  . Ran Out of Food in the Last Year: Not on file  Transportation Needs:   .  Lack of Transportation (Medical): Not on file  . Lack of Transportation (Non-Medical): Not on file  Physical Activity:   . Days of Exercise per Week: Not on file  . Minutes of Exercise per Session: Not on file  Stress:   . Feeling of Stress : Not on file  Social Connections:   . Frequency of Communication with Friends and Family: Not on file  . Frequency of Social Gatherings with Friends and Family: Not on file  . Attends Religious Services: Not on file  . Active Member of Clubs or Organizations: Not on file  . Attends Archivist Meetings: Not on file  . Marital Status: Not on file  Intimate Partner Violence:   . Fear of Current or Ex-Partner: Not on file  . Emotionally Abused: Not on file  . Physically Abused: Not on file  . Sexually Abused: Not on file    Outpatient Medications Prior to Visit   Medication Sig Dispense Refill  . amLODipine (NORVASC) 5 MG tablet Take 1 tablet (5 mg total) by mouth daily. 90 tablet 3  . omeprazole (PRILOSEC) 40 MG capsule TAKE 1 CAPSULE BY MOUTH TWICE A DAY 60 capsule 0  . simvastatin (ZOCOR) 10 MG tablet TAKE 1 TABLET BY MOUTH EVERYDAY AT BEDTIME 30 tablet 1  . acyclovir (ZOVIRAX) 400 MG tablet TAKE 1 TABLET (400 MG TOTAL) BY MOUTH 3 (THREE) TIMES DAILY. FOR 5 DAYS 15 tablet 5  . zolpidem (AMBIEN) 10 MG tablet TAKE 1 TABLET BY MOUTH EVERY DAY AT BEDTIME AS NEEDED 30 tablet 3   No facility-administered medications prior to visit.    No Known Allergies  ROS Review of Systems  All other systems reviewed and are negative.     Objective:    Physical Exam  Constitutional: He is oriented to person, place, and time. Vital signs are normal. He appears well-developed and well-nourished. He is cooperative.  HENT:  Head: Normocephalic.  Right Ear: Hearing, tympanic membrane and ear canal normal.  Left Ear: Hearing, tympanic membrane, external ear and ear canal normal.  Nose: Mucosal edema present. No rhinorrhea, sinus tenderness or septal deviation. No epistaxis.  No foreign bodies. Right sinus exhibits no maxillary sinus tenderness and no frontal sinus tenderness. Left sinus exhibits no maxillary sinus tenderness and no frontal sinus tenderness.  Mouth/Throat: Uvula is midline, oropharynx is clear and moist and mucous membranes are normal.  Eyes: Pupils are equal, round, and reactive to light. Conjunctivae, EOM and lids are normal. Lids are everted and swept, no foreign bodies found.  Neck: Trachea normal. Carotid bruit is not present. No thyroid mass and no thyromegaly present.  Cardiovascular: Normal rate, regular rhythm, S1 normal, S2 normal and normal heart sounds.  Pulmonary/Chest: Effort normal and breath sounds normal.  Abdominal: Soft. Normal aorta and bowel sounds are normal. He exhibits no distension and no mass. There is no  hepatosplenomegaly. There is no abdominal tenderness. There is no rebound, no guarding and no CVA tenderness. A hernia is present.    Know h/o hernia with management. Non surgical repair needed at this time.   Genitourinary:    Genitourinary Comments: Declined physical examination/subjective normal    Musculoskeletal:        General: No tenderness, deformity or edema. Normal range of motion.     Cervical back: Normal range of motion and neck supple.  Lymphadenopathy:       Head (right side): No submental, no submandibular, no tonsillar, no preauricular, no posterior auricular and no  occipital adenopathy present.       Head (left side): No submental, no submandibular, no tonsillar, no preauricular, no posterior auricular and no occipital adenopathy present.    He has no cervical adenopathy.    He has no axillary adenopathy.  Neurological: He is alert and oriented to person, place, and time. He has normal strength. No cranial nerve deficit. Coordination normal.  Skin: Skin is warm, dry and intact.  Psychiatric: He has a normal mood and affect. His speech is normal and behavior is normal. Judgment and thought content normal. Cognition and memory are normal.  Nursing note and vitals reviewed.   BP 130/82   Pulse 80   Temp 98.1 F (36.7 C) (Oral)   Resp 16   Ht 5' 10.5" (1.791 m)   Wt 197 lb 8 oz (89.6 kg)   SpO2 100%   BMI 27.94 kg/m  Wt Readings from Last 3 Encounters:  05/02/19 197 lb 8 oz (89.6 kg)  05/07/18 198 lb (89.8 kg)  04/29/18 200 lb (90.7 kg)     Health Maintenance Due  Topic Date Due  . HIV Screening  01/05/1990  . INFLUENZA VACCINE  10/23/2018    There are no preventive care reminders to display for this patient.  Lab Results  Component Value Date   TSH 0.71 09/10/2016   Lab Results  Component Value Date   WBC 5.8 10/15/2017   HGB 15.5 10/15/2017   HCT 44.1 10/15/2017   MCV 89.3 10/15/2017   PLT 245 10/15/2017   Lab Results  Component Value Date    NA 140 04/23/2018   K 4.5 04/23/2018   CO2 28 04/23/2018   GLUCOSE 83 04/23/2018   BUN 16 04/23/2018   CREATININE 1.07 04/23/2018   BILITOT 0.6 04/23/2018   ALKPHOS 30 (L) 09/10/2016   AST 19 04/23/2018   ALT 24 04/23/2018   PROT 7.7 04/23/2018   ALBUMIN 4.7 09/10/2016   CALCIUM 10.1 04/23/2018   Lab Results  Component Value Date   CHOL 170 04/23/2018   Lab Results  Component Value Date   HDL 48 04/23/2018   Lab Results  Component Value Date   LDLCALC 108 (H) 04/23/2018   Lab Results  Component Value Date   TRIG 47 04/23/2018   Lab Results  Component Value Date   CHOLHDL 3.5 04/23/2018   No results found for: HGBA1C    Assessment & Plan:  Follow up in 1 year for annual wellness examination.  Office will call you with today's abnormal lab results, you may view the labs with mychart.  Take your blood pressure at home 2-3 hours after taking blood pressure medications.  You may take Ambien only as needed after taking melatonin for your difficulty sleeping with night shift changes.  Drink plenty of water, eat a healthy low carb, low cholesterol, heart healthy diet, get exercise at least 4 times per week for 20 minutes.  Take melatonin for difficulty sleeping and then if continues that night may take as needed Ambien.  For your nasal redness you may use over the counter Flonase 1 spray each nostril daily. This may help your mild headache symptoms as well. Continue using over the counter medications for headache such as ibuprofen. Seek medical attention for worsening. Promptly report Blood Pressure that are elevated.   Problem List Items Addressed This Visit      Cardiovascular and Mediastinum   Essential hypertension - Primary   Relevant Orders   CBC with Differential/Platelet  COMPLETE METABOLIC PANEL WITH GFR     Other   HSV-2 infection   Relevant Medications   acyclovir (ZOVIRAX) 400 MG tablet   Family history of coronary artery disease   Relevant Orders    CBC with Differential/Platelet   COMPLETE METABOLIC PANEL WITH GFR   Lipid panel   Hyperlipidemia   Relevant Orders   Lipid panel   Shift work sleep disorder   Relevant Medications   zolpidem (AMBIEN) 5 MG tablet    Other Visit Diagnoses    Encounter for preventive health examination       Relevant Orders   CBC with Differential/Platelet   COMPLETE METABOLIC PANEL WITH GFR   Decreased sexual desire       Relevant Orders   Testosterone   Insomnia, unspecified type       Relevant Medications   zolpidem (AMBIEN) 5 MG tablet       Follow-up: Return in about 1 year (around 05/01/2020).    Annie Main, FNP

## 2019-05-04 LAB — HEMOGLOBIN A1C W/OUT EAG: Hgb A1c MFr Bld: 5.7 % of total Hgb — ABNORMAL HIGH (ref <5.7)

## 2019-05-04 LAB — LIPID PANEL
Cholesterol: 193 mg/dL (ref <200)
HDL: 51 mg/dL (ref >=40)
LDL Cholesterol (Calc): 126 mg/dL (calc) — ABNORMAL HIGH
Non-HDL Cholesterol (Calc): 142 mg/dL (calc) — ABNORMAL HIGH (ref <130)
Total CHOL/HDL Ratio: 3.8 (calc) (ref <5.0)
Triglycerides: 66 mg/dL (ref <150)

## 2019-05-04 LAB — COMPLETE METABOLIC PANEL WITH GFR
AG Ratio: 1.9 (calc) (ref 1.0–2.5)
ALT: 31 U/L (ref 9–46)
AST: 20 U/L (ref 10–40)
Albumin: 4.8 g/dL (ref 3.6–5.1)
Alkaline phosphatase (APISO): 33 U/L — ABNORMAL LOW (ref 36–130)
BUN: 18 mg/dL (ref 7–25)
CO2: 29 mmol/L (ref 20–32)
Calcium: 9.7 mg/dL (ref 8.6–10.3)
Chloride: 104 mmol/L (ref 98–110)
Creat: 1 mg/dL (ref 0.60–1.35)
GFR, Est African American: 106 mL/min/{1.73_m2} (ref >=60)
GFR, Est Non African American: 91 mL/min/{1.73_m2} (ref >=60)
Globulin: 2.5 g/dL (calc) (ref 1.9–3.7)
Glucose, Bld: 104 mg/dL — ABNORMAL HIGH (ref 65–99)
Potassium: 4.1 mmol/L (ref 3.5–5.3)
Sodium: 140 mmol/L (ref 135–146)
Total Bilirubin: 0.5 mg/dL (ref 0.2–1.2)
Total Protein: 7.3 g/dL (ref 6.1–8.1)

## 2019-05-04 LAB — CBC WITH DIFFERENTIAL/PLATELET
Absolute Monocytes: 545 cells/uL (ref 200–950)
Basophils Absolute: 29 cells/uL (ref 0–200)
Basophils Relative: 0.5 %
Eosinophils Absolute: 371 cells/uL (ref 15–500)
Eosinophils Relative: 6.4 %
HCT: 45 % (ref 38.5–50.0)
Hemoglobin: 15.8 g/dL (ref 13.2–17.1)
Lymphs Abs: 2616 cells/uL (ref 850–3900)
MCH: 31.5 pg (ref 27.0–33.0)
MCHC: 35.1 g/dL (ref 32.0–36.0)
MCV: 89.8 fL (ref 80.0–100.0)
MPV: 10.2 fL (ref 7.5–12.5)
Monocytes Relative: 9.4 %
Neutro Abs: 2239 cells/uL (ref 1500–7800)
Neutrophils Relative %: 38.6 %
Platelets: 258 10*3/uL (ref 140–400)
RBC: 5.01 10*6/uL (ref 4.20–5.80)
RDW: 12.6 % (ref 11.0–15.0)
Total Lymphocyte: 45.1 %
WBC: 5.8 10*3/uL (ref 3.8–10.8)

## 2019-05-04 LAB — TEST AUTHORIZATION

## 2019-05-04 LAB — TESTOSTERONE: Testosterone: 558 ng/dL (ref 250–827)

## 2019-05-09 ENCOUNTER — Telehealth: Payer: Self-pay

## 2019-05-09 MED ORDER — ZOLPIDEM TARTRATE 10 MG PO TABS: 10.0000 mg | ORAL_TABLET | Freq: Every evening | ORAL | 3 refills | Status: DC | PRN

## 2019-05-09 NOTE — Telephone Encounter (Signed)
Pt called and wanted to know why his zolpidem was switched from 10 mg to 5 mg. Pt states he has been on 10 mg for a year and a half and it worked fine. Per pt, he can see a difference taking the 5 and it is not working. Pt also states the dose change was not discussed during his visit. Please advise.

## 2019-05-09 NOTE — Telephone Encounter (Signed)
Pt made aware

## 2019-05-17 ENCOUNTER — Other Ambulatory Visit: Payer: Self-pay

## 2019-05-17 MED ORDER — AMLODIPINE BESYLATE 5 MG PO TABS: 5.0000 mg | ORAL_TABLET | Freq: Every day | ORAL | 3 refills | Status: DC

## 2019-06-14 ENCOUNTER — Other Ambulatory Visit: Payer: Self-pay | Admitting: Family Medicine

## 2019-06-14 DIAGNOSIS — E785 Hyperlipidemia, unspecified: Secondary | ICD-10-CM

## 2019-06-21 ENCOUNTER — Telehealth: Payer: Self-pay | Admitting: Nurse Practitioner

## 2019-06-21 ENCOUNTER — Other Ambulatory Visit: Payer: Self-pay | Admitting: Nurse Practitioner

## 2019-06-21 DIAGNOSIS — E785 Hyperlipidemia, unspecified: Secondary | ICD-10-CM

## 2019-06-21 MED ORDER — SIMVASTATIN 10 MG PO TABS: ORAL_TABLET | ORAL | 6 refills | Status: DC

## 2019-06-21 NOTE — Telephone Encounter (Signed)
Patient calling to get a refill on simvastatin [facility]

## 2019-08-23 ENCOUNTER — Ambulatory Visit (INDEPENDENT_AMBULATORY_CARE_PROVIDER_SITE_OTHER): Payer: 59 | Admitting: Nurse Practitioner

## 2019-08-23 ENCOUNTER — Other Ambulatory Visit: Payer: Self-pay

## 2019-08-23 VITALS — BP 136/90 | HR 86 | Temp 98.1°F | Resp 18 | Wt 199.2 lb

## 2019-08-23 DIAGNOSIS — K458 Other specified abdominal hernia without obstruction or gangrene: Secondary | ICD-10-CM

## 2019-08-23 NOTE — Progress Notes (Signed)
Established Patient Office Visit  Subjective:  Patient ID: Kevin Paul, male    DOB: 03-13-1976  Age: 44 y.o. MRN: [id number]  CC:  Chief Complaint  Patient presents with  . Hernia    hernia pain started x1 week, had it since 7 but now it feels different, ibuprofen was taken    HPI Kevin Paul is a 44 year old male presenting to the clinic related to a abdominal hernia that he had while is now causing some discomfort.  The symptoms started approximately 3 to 4 days ago with activity he felt a pulling sensation in since a.m. has experienced mild discomfort on and off.  He denies nausea, vomiting, or change in bowel habits.  He has had no change in appetite no fever or chills.  He would like a referral to general surgery for evaluation and possible surgical management.  Past Medical History:  Diagnosis Date  . Cardiomegaly 08/24/2014   [facility]  . Hematuria 08/24/2014   exercise induced- [facility]  . HSV-2 infection     Past Surgical History:  Procedure Laterality Date  . ANTERIOR CRUCIATE LIGAMENT REPAIR  07/1994  . KNEE ARTHROSCOPY Bilateral    Has had multiple knee surgeries in his Teens-20s    Family History  Problem Relation Age of Onset  . Hyperlipidemia Father   . Hypertension Father   . Heart disease Father 7       MI--stent  . Stroke Paternal Grandfather 50       Stroke  . Colon polyps Neg Hx   . Esophageal cancer Neg Hx   . Inflammatory bowel disease Neg Hx   . Liver disease Neg Hx   . Pancreatic cancer Neg Hx   . Rectal cancer Neg Hx   . Stomach cancer Neg Hx     Social History   Socioeconomic History  . Marital status: Married    Spouse name: Not on file  . Number of children: Not on file  . Years of education: Not on file  . Highest education level: Not on file  Occupational History  . Not on file  Tobacco Use  . Smoking status: Never Smoker  . Smokeless tobacco: Never Used  Substance and Sexual Activity  . Alcohol use:  Yes    Alcohol/week: 4.0 standard drinks    Types: 4 Cans of beer per week  . Drug use: No  . Sexual activity: Yes  Other Topics Concern  . Not on file  Social History Narrative   Paramedic for [employer]   Married. 3 children--2 are step children  1 is his own biologic child: Ages 78, 53, 72.   Last year started exercising: Runs 3 miles / day 3-4 days / week.   Also does push-ups, sit ups etc at home.    THIS  INFO  ENTERED  06/22/2013   Social Determinants of Health   Financial Resource Strain:   . Difficulty of Paying Living Expenses:   Food Insecurity:   . Worried About Charity fundraiser in the Last Year:   . Arboriculturist in the Last Year:   Transportation Needs:   . Film/video editor (Medical):   Marland Kitchen Lack of Transportation (Non-Medical):   Physical Activity:   . Days of Exercise per Week:   . Minutes of Exercise per Session:   Stress:   . Feeling of Stress :   Social Connections:   . Frequency of Communication with Friends and Family:   .  Frequency of Social Gatherings with Friends and Family:   . Attends Religious Services:   . Active Member of Clubs or Organizations:   . Attends Archivist Meetings:   Marland Kitchen Marital Status:   Intimate Partner Violence:   . Fear of Current or Ex-Partner:   . Emotionally Abused:   Marland Kitchen Physically Abused:   . Sexually Abused:     Outpatient Medications Prior to Visit  Medication Sig Dispense Refill  . acyclovir (ZOVIRAX) 400 MG tablet TAKE 1 TABLET (400 MG TOTAL) BY MOUTH 3 (THREE) TIMES DAILY. FOR 5 DAYS 15 tablet 5  . amLODipine (NORVASC) 5 MG tablet Take 1 tablet (5 mg total) by mouth daily. 90 tablet 3  . omeprazole (PRILOSEC) 40 MG capsule TAKE 1 CAPSULE BY MOUTH TWICE A DAY 60 capsule 0  . simvastatin (ZOCOR) 10 MG tablet TAKE 1 TABLET BY MOUTH EVERYDAY AT BEDTIME 30 tablet 6  . zolpidem (AMBIEN) 10 MG tablet Take 1 tablet (10 mg total) by mouth at bedtime as needed for sleep. 30 tablet 3   No  facility-administered medications prior to visit.    No Known Allergies  ROS Review of Systems  All other systems reviewed and are negative.     Objective:    Physical Exam  Constitutional: He is oriented to person, place, and time. He appears well-developed and well-nourished.  HENT:  Head: Normocephalic.  Eyes: Pupils are equal, round, and reactive to light. EOM are normal.  Cardiovascular: Normal rate.  Pulmonary/Chest: Effort normal.  Abdominal: Soft. Bowel sounds are normal. A hernia is present.    Musculoskeletal:     Cervical back: Normal range of motion and neck supple.  Neurological: He is alert and oriented to person, place, and time.  Skin: Skin is warm and dry.  Psychiatric: He has a normal mood and affect. His behavior is normal. Thought content normal.  Nursing note and vitals reviewed.   BP 136/90 (BP Location: Left Arm, Patient Position: Sitting, Cuff Size: Normal)   Pulse 86   Temp 98.1 F (36.7 C) (Temporal)   Resp 18   Wt 199 lb 3.2 oz (90.4 kg)   SpO2 96%   BMI 28.18 kg/m  Wt Readings from Last 3 Encounters:  08/23/19 199 lb 3.2 oz (90.4 kg)  05/02/19 197 lb 8 oz (89.6 kg)  05/07/18 198 lb (89.8 kg)     Health Maintenance Due  Topic Date Due  . HIV Screening  Never done    There are no preventive care reminders to display for this patient.  Lab Results  Component Value Date   TSH 0.71 09/10/2016   Lab Results  Component Value Date   WBC 5.8 05/02/2019   HGB 15.8 05/02/2019   HCT 45.0 05/02/2019   MCV 89.8 05/02/2019   PLT 258 05/02/2019   Lab Results  Component Value Date   NA 140 05/02/2019   K 4.1 05/02/2019   CO2 29 05/02/2019   GLUCOSE 104 (H) 05/02/2019   BUN 18 05/02/2019   CREATININE 1.00 05/02/2019   BILITOT 0.5 05/02/2019   ALKPHOS 30 (L) 09/10/2016   AST 20 05/02/2019   ALT 31 05/02/2019   PROT 7.3 05/02/2019   ALBUMIN 4.7 09/10/2016   CALCIUM 9.7 05/02/2019   Lab Results  Component Value Date   CHOL 193  05/02/2019   Lab Results  Component Value Date   HDL 51 05/02/2019   Lab Results  Component Value Date   LDLCALC 126 (H) 05/02/2019  Lab Results  Component Value Date   TRIG 66 05/02/2019   Lab Results  Component Value Date   CHOLHDL 3.8 05/02/2019   Lab Results  Component Value Date   HGBA1C 5.7 (H) 05/02/2019      Assessment & Plan:   Problem List Items Addressed This Visit    None    Visit Diagnoses    Other specified abdominal hernia without obstruction or gangrene    -  Primary   Relevant Orders   Ambulatory referral to General Surgery      No orders of the defined types were placed in this encounter.   Follow-up: Return if symptoms worsen or fail to improve.    Annie Main, FNP

## 2019-09-01 ENCOUNTER — Other Ambulatory Visit: Payer: Self-pay | Admitting: Nurse Practitioner

## 2019-09-01 DIAGNOSIS — G4700 Insomnia, unspecified: Secondary | ICD-10-CM

## 2019-09-01 NOTE — Telephone Encounter (Signed)
Ok to refill??  Last office visit 08/23/2019.  Last refill 05/09/2019, #3 refills.

## 2019-09-05 ENCOUNTER — Other Ambulatory Visit: Payer: Self-pay | Admitting: Surgery

## 2019-09-05 DIAGNOSIS — R1900 Intra-abdominal and pelvic swelling, mass and lump, unspecified site: Secondary | ICD-10-CM

## 2019-09-06 ENCOUNTER — Other Ambulatory Visit: Payer: Self-pay

## 2019-09-06 ENCOUNTER — Encounter: Payer: Self-pay | Admitting: Nurse Practitioner

## 2019-09-06 ENCOUNTER — Ambulatory Visit: Payer: 59 | Admitting: Nurse Practitioner

## 2019-09-06 VITALS — BP 130/84 | HR 72 | Temp 97.6°F | Resp 18 | Wt 199.6 lb

## 2019-09-06 DIAGNOSIS — I10 Essential (primary) hypertension: Secondary | ICD-10-CM | POA: Diagnosis not present

## 2019-09-06 DIAGNOSIS — R519 Headache, unspecified: Secondary | ICD-10-CM

## 2019-09-06 DIAGNOSIS — J329 Chronic sinusitis, unspecified: Secondary | ICD-10-CM | POA: Diagnosis not present

## 2019-09-06 DIAGNOSIS — U071 COVID-19: Secondary | ICD-10-CM

## 2019-09-06 HISTORY — DX: COVID-19: U07.1

## 2019-09-06 MED ORDER — LISINOPRIL-HYDROCHLOROTHIAZIDE 10-12.5 MG PO TABS: 1.0000 | ORAL_TABLET | Freq: Every day | ORAL | 1 refills | Status: DC

## 2019-09-06 MED ORDER — TOPIRAMATE 25 MG PO TABS: 25.0000 mg | ORAL_TABLET | Freq: Every day | ORAL | 3 refills | Status: DC

## 2019-09-06 NOTE — Patient Instructions (Addendum)
1. Use flonase nasal spray each nostril 2 sprays each nostril daily for 5 days then decrease to one spray each nostril daily for the next three months, if sxs not resolved please notify office and referral to ENT will be placed. If sxs worsen between now and 3 month make an apt with me. Also take daily antihistamine such as claritin.   2. Stop taking Amlodipine and start taking new prescription of Lisinopril. Take blood pressure 3 hours after taking the medication in the morning and keep log. Call the blood pressure to me in 14 days. BP goal is 140/90 or less 3 hours after taking the medication.   3. Follow low fat/low cholesterol, 2 Gram per day sodium diet with drinking plenty of water daily.   4. Start taking Topamax daily for headaches. Follow up with me if no resolution in sxs of worsening most likely related to inflammation and erythema to sinus from post COVID infection. If headache become different or worse than any headache you have ever had seek urgent medical attention.

## 2019-09-06 NOTE — Progress Notes (Signed)
Established Patient Office Visit  Subjective:  Patient ID: Kevin Paul, male    DOB: Mar 27, 1975  Age: 44 y.o. MRN: [id number]  CC:  Chief Complaint  Patient presents with  . Headache    comes and goes since having covid in Dec 2020, still has no smell, feels like sinus pressure, ibuprofen and excedrin was taken with no relief, flonase did not help  . Hypertension    HPI Kevin Paul is a 44 year old male presenting to the clinic with a few concerns. He had COVID infection in Dec 2020 with loss of taste, smell, and nasal sinus sxs congestion sxs. Since then he has not had a return of since of smell. A few months ago he did use flonase and claritin for about 5 days with no improvement so he stopped. No other txs tried. He has no other sxs such as fever, chills, nasal discharge, general body ache, loss of taste, cough.   He has also had a mild headache he feels and describes as a sinus headache since Dec. COVID infection. He has not tried tx.   No sick contacts are contacts with similar sxs.   He has noticed his bp's are elevated over the past few months with readings randomly taken at 150/100-150/90 taking Norvasc 5 mg in the evening. He does not recall being prescribed other bp medications before.     Past Medical History:  Diagnosis Date  . Cardiomegaly 08/24/2014   [facility]  . Hematuria 08/24/2014   exercise induced- [facility]  . HSV-2 infection     Past Surgical History:  Procedure Laterality Date  . ANTERIOR CRUCIATE LIGAMENT REPAIR  07/1994  . KNEE ARTHROSCOPY Bilateral    Has had multiple knee surgeries in his Teens-20s    Family History  Problem Relation Age of Onset  . Hyperlipidemia Father   . Hypertension Father   . Heart disease Father 107       MI--stent  . Stroke Paternal Grandfather 74       Stroke  . Colon polyps Neg Hx   . Esophageal cancer Neg Hx   . Inflammatory bowel disease Neg Hx   . Liver disease Neg Hx   . Pancreatic cancer  Neg Hx   . Rectal cancer Neg Hx   . Stomach cancer Neg Hx     Social History   Socioeconomic History  . Marital status: Married    Spouse name: Not on file  . Number of children: Not on file  . Years of education: Not on file  . Highest education level: Not on file  Occupational History  . Not on file  Tobacco Use  . Smoking status: Never Smoker  . Smokeless tobacco: Never Used  Substance and Sexual Activity  . Alcohol use: Yes    Alcohol/week: 4.0 standard drinks    Types: 4 Cans of beer per week  . Drug use: No  . Sexual activity: Yes  Other Topics Concern  . 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   Last year started exercising: Runs 3 miles / day 3-4 days / week.   Also does push-ups, sit ups etc at home.    THIS  INFO  ENTERED  06/22/2013   Social Determinants of Health   Financial Resource Strain:   . Difficulty of Paying Living  Expenses:   Food Insecurity:   . Worried About Charity fundraiser in the Last Year:   . Arboriculturist in the Last Year:   Transportation Needs:   . Film/video editor (Medical):   Marland Kitchen Lack of Transportation (Non-Medical):   Physical Activity:   . Days of Exercise per Week:   . Minutes of Exercise per Session:   Stress:   . Feeling of Stress :   Social Connections:   . Frequency of Communication with Friends and Family:   . Frequency of Social Gatherings with Friends and Family:   . Attends Religious Services:   . Active Member of Clubs or Organizations:   . Attends Archivist Meetings:   Marland Kitchen Marital Status:   Intimate Partner Violence:   . Fear of Current or Ex-Partner:   . Emotionally Abused:   Marland Kitchen Physically Abused:   . Sexually Abused:     Outpatient Medications Prior to Visit  Medication Sig Dispense Refill  . acyclovir (ZOVIRAX) 400 MG tablet TAKE 1 TABLET (400 MG TOTAL) BY MOUTH 3 (THREE)  TIMES DAILY. FOR 5 DAYS 15 tablet 5  . simvastatin (ZOCOR) 10 MG tablet TAKE 1 TABLET BY MOUTH EVERYDAY AT BEDTIME 30 tablet 6  . zolpidem (AMBIEN) 10 MG tablet TAKE 1 TABLET BY MOUTH AT BEDTIME AS NEEDED FOR SLEEP. 30 tablet 0  . amLODipine (NORVASC) 5 MG tablet Take 1 tablet (5 mg total) by mouth daily. 90 tablet 3  . omeprazole (PRILOSEC) 40 MG capsule TAKE 1 CAPSULE BY MOUTH TWICE A DAY 60 capsule 0   No facility-administered medications prior to visit.    No Known Allergies  ROS Review of Systems  All other systems reviewed and are negative.     Objective:    Physical Exam Vitals and nursing note reviewed.  Constitutional:      Appearance: Normal appearance. He is well-developed and well-groomed.  HENT:     Head: Normocephalic.     Right Ear: External ear normal.     Left Ear: External ear normal.     Nose: Mucosal edema present. No nasal deformity, septal deviation, signs of injury, laceration, congestion or rhinorrhea.     Right Sinus: No maxillary sinus tenderness or frontal sinus tenderness.     Left Sinus: No maxillary sinus tenderness or frontal sinus tenderness.     Comments: mucosal erythema bilateral  Eyes:     Extraocular Movements: Extraocular movements intact.     Pupils: Pupils are equal, round, and reactive to light.  Cardiovascular:     Rate and Rhythm: Normal rate.  Pulmonary:     Effort: Pulmonary effort is normal.  Musculoskeletal:        General: Normal range of motion.     Cervical back: Normal range of motion and neck supple.  Skin:    General: Skin is warm and dry.     Coloration: Skin is not jaundiced.     Findings: No bruising.  Neurological:     Mental Status: He is alert and oriented to person, place, and time.     GCS: GCS eye subscore is 4. GCS verbal subscore is 5. GCS motor subscore is 6.  Psychiatric:        Attention and Perception: Attention and perception normal.        Mood and Affect: Mood and affect normal.        Speech:  Speech normal.        Behavior:  Behavior normal. Behavior is cooperative.        Cognition and Memory: Cognition normal.        Judgment: Judgment normal.     BP 130/84 (BP Location: Left Arm, Patient Position: Sitting, Cuff Size: Normal)   Pulse 72   Temp 97.6 F (36.4 C) (Temporal)   Resp 18   Wt 199 lb 9.6 oz (90.5 kg)   SpO2 100%   BMI 28.23 kg/m  Wt Readings from Last 3 Encounters:  09/06/19 199 lb 9.6 oz (90.5 kg)  08/23/19 199 lb 3.2 oz (90.4 kg)  05/02/19 197 lb 8 oz (89.6 kg)     There are no preventive care reminders to display for this patient.  There are no preventive care reminders to display for this patient.  Lab Results  Component Value Date   TSH 0.71 09/10/2016   Lab Results  Component Value Date   WBC 5.8 05/02/2019   HGB 15.8 05/02/2019   HCT 45.0 05/02/2019   MCV 89.8 05/02/2019   PLT 258 05/02/2019   Lab Results  Component Value Date   NA 140 05/02/2019   K 4.1 05/02/2019   CO2 29 05/02/2019   GLUCOSE 104 (H) 05/02/2019   BUN 18 05/02/2019   CREATININE 1.00 05/02/2019   BILITOT 0.5 05/02/2019   ALKPHOS 30 (L) 09/10/2016   AST 20 05/02/2019   ALT 31 05/02/2019   PROT 7.3 05/02/2019   ALBUMIN 4.7 09/10/2016   CALCIUM 9.7 05/02/2019   Lab Results  Component Value Date   CHOL 193 05/02/2019   Lab Results  Component Value Date   HDL 51 05/02/2019   Lab Results  Component Value Date   LDLCALC 126 (H) 05/02/2019   Lab Results  Component Value Date   TRIG 66 05/02/2019   Lab Results  Component Value Date   CHOLHDL 3.8 05/02/2019   Lab Results  Component Value Date   HGBA1C 5.7 (H) 05/02/2019      Assessment & Plan:  1. Use flonase nasal spray each nostril 2 sprays each nostril daily for 5 days then decrease to one spray each nostril daily for the next three months, if sxs not resolved please notify office and referral to ENT will be placed. If sxs worsen between now and 3 month make an apt with me. Also take daily  antihistamine such as claritin.   2. Stop taking Amlodipine and start taking new prescription of Lisinopril. Take blood pressure 3 hours after taking the medication in the morning and keep log. Call the blood pressure to me in 14 days. BP goal is 140/90 or less 3 hours after taking the medication.   3. Follow low fat/low cholesterol, 2 Gram per day sodium diet with drinking plenty of water daily.   4. Start taking Topamax daily for headaches. Follow up with me if no resolution in sxs of worsening most likely related to inflammation and erythema to sinus from post COVID infection. If headache become different or worse than any headache you have ever had seek urgent medical attention.   Problem List Items Addressed This Visit      Cardiovascular and Mediastinum   Essential hypertension   Relevant Medications   lisinopril-hydrochlorothiazide (ZESTORETIC) 10-12.5 MG tablet    Other Visit Diagnoses    Nonintractable headache, unspecified chronicity pattern, unspecified headache type    -  Primary   Relevant Medications   topiramate (TOPAMAX) 25 MG tablet   Sinusitis, unspecified chronicity, unspecified location  educational print out: HTN adult; Sinus headache; sinusitis adult easy to read.  Meds ordered this encounter  Medications  . lisinopril-hydrochlorothiazide (ZESTORETIC) 10-12.5 MG tablet    Sig: Take 1 tablet by mouth daily.    Dispense:  90 tablet    Refill:  1  . topiramate (TOPAMAX) 25 MG tablet    Sig: Take 1 tablet (25 mg total) by mouth daily.    Dispense:  30 tablet    Refill:  3    Follow-up: Return in about 3 months (around 12/07/2019).    Annie Main, FNP

## 2019-09-14 ENCOUNTER — Other Ambulatory Visit: Payer: Self-pay | Admitting: Surgery

## 2019-09-14 ENCOUNTER — Ambulatory Visit
Admission: RE | Admit: 2019-09-14 | Discharge: 2019-09-14 | Disposition: A | Discharge status: Home / Self Care | Payer: 59 | Source: Ambulatory Visit | Attending: Surgery | Admitting: Surgery

## 2019-09-14 DIAGNOSIS — R1900 Intra-abdominal and pelvic swelling, mass and lump, unspecified site: Secondary | ICD-10-CM

## 2019-10-02 ENCOUNTER — Other Ambulatory Visit: Payer: Self-pay | Admitting: Nurse Practitioner

## 2019-10-03 NOTE — Telephone Encounter (Signed)
Ok to refill??  Last office visit 09/06/2019.  Last refill 09/01/2019.  Ok to add refills to prescription?

## 2019-10-04 ENCOUNTER — Telehealth: Payer: Self-pay

## 2019-10-04 NOTE — Telephone Encounter (Signed)
The Ambien is to be taken PRN. The refil was for 30 days therefor filled 25. If he is taking prn not daily that should be enough.

## 2019-10-04 NOTE — Telephone Encounter (Signed)
Pt called and wanted to know why you changed his quantity on his ambien. Please advise.

## 2019-10-10 ENCOUNTER — Ambulatory Visit: Payer: Self-pay | Admitting: Surgery

## 2019-10-10 NOTE — H&P (Signed)
CC: Referred for supraumbilical bulge  HPI: [name] is a very pleasant 21yoM with no known medical issues who presents for evaluation of a supraumbilical bulge that he is needed since he is a child. He reports he had some discomfort related to this twice and his adult life. However, beginning 3 weeks ago, he began having significant constant pain that lasted approximately 2-3 days and is located exactly where this bulge is present. He denies any skin changes. He denied any issues with nausea/vomiting. Denied any changes in his bowels. He reported that the pain was made worse by bending over or pushing on it. Over the ensuing couple of days has also subsided and has not completely resolved. He reports he is told as a child that he had a small hernia there and could be fixed for cosmetic reasons or if he develops symptoms so he has opted to observe it until now. He reports he has only had left knee surgery, no prior abdominal surgical history  PMH: Denies  PSH: He denies any prior abdominal surgical history  FHx: Denies FHx of colorectal, breast, endometrial, ovarian or cervical cancer  Social: Denies use of tobacco/EtOH/drugs. He works as a Audiological scientist with [employer]  ROS: A comprehensive 10 system review of systems was completed with the patient and pertinent findings as noted above.  The patient is a 44 year old male.   Past Surgical History Kevin Paul, Kevin Paul; 08/31/2019 11:06 AM) Knee Surgery  Bilateral.  Diagnostic Studies History (Kevin Paul, CMA; 08/31/2019 11:06 AM) Colonoscopy  never  Allergies (Kevin Paul, CMA; 08/31/2019 11:07 AM) No Known Drug Allergies  [08/31/2019]: Allergies Reconciled   Medication History (Kevin Paul, CMA; 08/31/2019 11:07 AM) Simvastatin (10MG Tablet, Oral) Active. amLODIPine Besylate (5MG Tablet, Oral) Active. Zolpidem Tartrate (10MG Tablet, Oral) Active. Medications Reconciled  Social History Kevin Paul, CMA; 08/31/2019 11:06  AM) Alcohol use  Moderate alcohol use. No caffeine use  No drug use  Tobacco use  Never smoker.  Family History (Kevin Paul, Kevin Paul; 08/31/2019 11:06 AM) Heart Disease  Father. Hypertension  Father.  Other Problems (Kevin Paul, Kaka; 08/31/2019 11:06 AM) High blood pressure  Umbilical Hernia Repair     Review of Systems Kevin Paul; 08/31/2019 11:25 AM) General Not Present- Chills and Fever. HEENT Not Present- Blurred Vision and Double Vision. Respiratory Not Present- Cough and Decreased Exercise Tolerance. Cardiovascular Not Present- Chest Pain, Claudications and Difficulty Breathing On Exertion. Gastrointestinal Not Present- Abdominal Pain, Bloating, Bloody Stool, Change in Bowel Habits, Chronic diarrhea, Constipation, Difficulty Swallowing, Excessive gas, Gets full quickly at meals, Hemorrhoids, Indigestion, Nausea, Rectal Pain and Vomiting. Male Genitourinary Not Present- Blood in Urine, Change in Urinary Stream, Frequency, Impotence, Nocturia, Painful Urination, Urgency and Urine Leakage. Musculoskeletal Not Present- Arm Weakness and Back Pain. Neurological Not Present- Decreased Memory and Difficulty Speaking. Psychiatric Not Present- Anxiety and Depression. Hematology Not Present- Abnormal Bleeding, Blood Clots and Blood Thinners.  Vitals (Kevin Nolan CMA; 08/31/2019 11:08 AM) 08/31/2019 11:07 AM Weight: 198.13 lb Height: 70in Body Surface Area: 2.08 m Body Mass Index: 28.43 kg/m  Temp.: 97.66F  Pulse: 85 (Regular)  BP: 130/72(Sitting, Left Arm, Standard)       Physical Exam Kevin Paul; 08/31/2019 11:26 AM) The physical exam findings are as follows: Note: Constitutional: No acute distress; conversant; no deformities; wearing mask Eyes: Moist conjunctiva; no lid lag; anicteric sclerae; pupils equal and round Neck: Trachea midline; no palpable thyromegaly Lungs: Normal respiratory effort; no tactile fremitus CV: rrr;  no  palpable thrill; no pitting edema GI: Abdomen soft, nontender, nondistended; no palpable hepatosplenomegaly. Supraumbilical ventral bulge midway between xyphoid and umbilicus; no clear fascial defect palpable but ridge of tissue here. No tenderness to palpation. No skin changes MSK: Normal gait; no clubbing/cyanosis Psychiatric: Appropriate affect; alert and oriented 3 Lymphatic: No palpable cervical or axillary lymphadenopathy    Assessment & Plan Kevin Paul; 08/31/2019 11:30 AM) ABDOMINAL WALL BULGE (R19.00) Story: Mr. [name] is a very pleasant 86FIE with supraumbilical bulge of abdominal wall - previously minimally symptomatic but had significant episode now of pain 3 weeks ago - previously told he had hernia here and is requesting having this repaired Impression: -This is not clearly reducible but there is a small bulge at this location which may represent a small knuckle of fat. We'll plan to obtain an abdominal wall ultrasound to confirm that this is in fact a hernia. -Assuming this is confirmed with ultrasound, we discussed options going forward. Given his significant symptoms related to this, he is interested in pursuing surgery. We reviewed open ventral hernia repair with possible placement of mesh based on size of defect intraoperatively. We discussed scenarios where mesh may be placed including for defects >2 cm in size.  -The anatomy and physiology of the GI tract and abdominal wall was discussed at length with the patient with associated illustrations. The pathophysiology of hernias was discussed at length with associated pictures. -We reviewed open ventral hernia repair with possible mesh placement -The planned procedure, material risks (including, but not limited to, pain, bleeding, infection, scarring, need for blood transfusion, damage to surrounding structures-blood vessels/nerves/viscus/organs, need for additional procedures, recurrence of hernia, chronic pain, mesh  complications including erosion into other structures/vessels/organs/viscus, pneumonia, heart attack, stroke, death) benefits and alternatives to surgery were discussed at length. I noted a good probability that the procedure help improve his symptoms. The patient's questions were answered to his satisfaction, he voiced understanding and they elected to proceed with surgery. Additionally, we discussed typical postoperative expectations and the recovery process, lifting restrictions for 6 weeks - nothing more than 10 lbs as well.  This patient encounter took 35 minutes today to perform the following: take history, perform exam, review outside records, interpret imaging, counsel the patient on their diagnosis and document encounter, findings & plan in the EHR Current Plans Pt Education - CCS Mesh education: discussed with patient and provided information. Signed by Ileana Roup, Paul (08/31/2019 11:31 AM)

## 2019-10-17 ENCOUNTER — Other Ambulatory Visit: Payer: Self-pay

## 2019-10-17 ENCOUNTER — Ambulatory Visit: Payer: 59 | Admitting: Nurse Practitioner

## 2019-10-17 VITALS — BP 122/80 | HR 86 | Temp 97.9°F | Resp 18 | Wt 199.4 lb

## 2019-10-17 DIAGNOSIS — N529 Male erectile dysfunction, unspecified: Secondary | ICD-10-CM | POA: Diagnosis not present

## 2019-10-17 DIAGNOSIS — I10 Essential (primary) hypertension: Secondary | ICD-10-CM | POA: Diagnosis not present

## 2019-10-17 MED ORDER — TADALAFIL 10 MG PO TABS: ORAL_TABLET | ORAL | 3 refills | Status: DC

## 2019-10-17 NOTE — Progress Notes (Signed)
Established Patient Office Visit  Subjective:  Patient ID: Kevin Paul, male    DOB: 21-Jan-1976  Age: 44 y.o. MRN: [id number]  CC:  Chief Complaint  Patient presents with  . Hypertension    follow up    HPI Kevin Paul is a 44 year old presenting for HTN follow up appt after treatment. Also Lab review pt desired in person with provider. Also discussed that he is having issues with ED, not able to get erection and if he does cannot keep erection, would like medication treatment.    Home blood pressures have been at target < 140/90.     Past Medical History:  Diagnosis Date  . Cardiomegaly 08/24/2014   [facility]  . COVID-19 virus infection 09/06/2019  . COVID-19 virus infection 09/06/2019  . Hematuria 08/24/2014   exercise induced- [facility]  . HSV-2 infection     Past Surgical History:  Procedure Laterality Date  . ANTERIOR CRUCIATE LIGAMENT REPAIR  07/1994  . KNEE ARTHROSCOPY Bilateral    Has had multiple knee surgeries in his Teens-20s    Family History  Problem Relation Age of Onset  . Hyperlipidemia Father   . Hypertension Father   . Heart disease Father 44       MI--stent  . Stroke Paternal Grandfather 99       Stroke  . Colon polyps Neg Hx   . Esophageal cancer Neg Hx   . Inflammatory bowel disease Neg Hx   . Liver disease Neg Hx   . Pancreatic cancer Neg Hx   . Rectal cancer Neg Hx   . Stomach cancer Neg Hx     Social History   Socioeconomic History  . Marital status: Married    Spouse name: Not on file  . Number of children: Not on file  . Years of education: Not on file  . Highest education level: Not on file  Occupational History  . Not on file  Tobacco Use  . Smoking status: Never Smoker  . Smokeless tobacco: Never Used  Substance and Sexual Activity  . Alcohol use: Yes    Alcohol/week: 4.0 standard drinks    Types: 4 Cans of beer per week  . Drug use: No  . Sexual activity: Yes  Other Topics Concern  . Not on file    Social History Narrative   Paramedic for [employer]   Married. 3 children--2 are step children  1 is his own biologic child: Ages 45, 77, 45.   Last year started exercising: Runs 3 miles / day 3-4 days / week.   Also does push-ups, sit ups etc at home.    THIS  INFO  ENTERED  06/22/2013   Social Determinants of Health   Financial Resource Strain:   . Difficulty of Paying Living Expenses:   Food Insecurity:   . Worried About Charity fundraiser in the Last Year:   . Arboriculturist in the Last Year:   Transportation Needs:   . Film/video editor (Medical):   Marland Kitchen Lack of Transportation (Non-Medical):   Physical Activity:   . Days of Exercise per Week:   . Minutes of Exercise per Session:   Stress:   . Feeling of Stress :   Social Connections:   . Frequency of Communication with Friends and Family:   . Frequency of Social Gatherings with Friends and Family:   . Attends Religious Services:   . Active Member of Clubs or Organizations:   .  Attends Archivist Meetings:   Marland Kitchen Marital Status:   Intimate Partner Violence:   . Fear of Current or Ex-Partner:   . Emotionally Abused:   Marland Kitchen Physically Abused:   . Sexually Abused:     Outpatient Medications Prior to Visit  Medication Sig Dispense Refill  . acyclovir (ZOVIRAX) 400 MG tablet TAKE 1 TABLET (400 MG TOTAL) BY MOUTH 3 (THREE) TIMES DAILY. FOR 5 DAYS 15 tablet 5  . lisinopril-hydrochlorothiazide (ZESTORETIC) 10-12.5 MG tablet Take 1 tablet by mouth daily. 90 tablet 1  . simvastatin (ZOCOR) 10 MG tablet TAKE 1 TABLET BY MOUTH EVERYDAY AT BEDTIME 30 tablet 6  . topiramate (TOPAMAX) 25 MG tablet Take 1 tablet (25 mg total) by mouth daily. 30 tablet 3  . zolpidem (AMBIEN) 10 MG tablet TAKE 1 TABLET BY MOUTH EVERY DAY AT BEDTIME AS NEEDED FOR SLEEP 25 tablet 0   No facility-administered medications prior to visit.    No Known Allergies  ROS Review of Systems  All other systems reviewed and are negative.      Objective:    Physical Exam Vitals and nursing note reviewed.  Constitutional:      General: He is not in acute distress.    Appearance: Normal appearance. He is well-developed and well-groomed. He is not ill-appearing or diaphoretic.  HENT:     Head: Normocephalic.  Eyes:     Extraocular Movements: Extraocular movements intact.     Conjunctiva/sclera: Conjunctivae normal.     Pupils: Pupils are equal, round, and reactive to light.  Neck:     Vascular: No JVD.  Cardiovascular:     Rate and Rhythm: Normal rate and regular rhythm.     Pulses: Normal pulses.     Heart sounds: Normal heart sounds.  Pulmonary:     Effort: Pulmonary effort is normal.     Breath sounds: Normal breath sounds.  Musculoskeletal:     Cervical back: Normal range of motion and neck supple.     Right lower leg: No edema.     Left lower leg: No edema.  Skin:    General: Skin is warm and dry.     Coloration: Skin is not jaundiced or pale.     Findings: No bruising or erythema.  Neurological:     General: No focal deficit present.     Mental Status: He is alert and oriented to person, place, and time.  Psychiatric:        Attention and Perception: Attention normal.        Mood and Affect: Mood normal.        Speech: Speech normal.        Behavior: Behavior normal. Behavior is cooperative.     BP 122/80 (BP Location: Left Arm, Patient Position: Sitting, Cuff Size: Normal)   Pulse 86   Temp 97.9 F (36.6 C) (Temporal)   Resp 18   Wt 199 lb 6.4 oz (90.4 kg)   SpO2 97%   BMI 28.21 kg/m  Wt Readings from Last 3 Encounters:  10/17/19 199 lb 6.4 oz (90.4 kg)  09/06/19 199 lb 9.6 oz (90.5 kg)  08/23/19 199 lb 3.2 oz (90.4 kg)     Health Maintenance Due  Topic Date Due  . COVID-19 Vaccine (1) Never done  . INFLUENZA VACCINE  10/23/2019    There are no preventive care reminders to display for this patient.  Lab Results  Component Value Date   TSH 0.71 09/10/2016   Lab Results  Component  Value Date   WBC 5.8 05/02/2019   HGB 15.8 05/02/2019   HCT 45.0 05/02/2019   MCV 89.8 05/02/2019   PLT 258 05/02/2019   Lab Results  Component Value Date   NA 140 05/02/2019   K 4.1 05/02/2019   CO2 29 05/02/2019   GLUCOSE 104 (H) 05/02/2019   BUN 18 05/02/2019   CREATININE 1.00 05/02/2019   BILITOT 0.5 05/02/2019   ALKPHOS 30 (L) 09/10/2016   AST 20 05/02/2019   ALT 31 05/02/2019   PROT 7.3 05/02/2019   ALBUMIN 4.7 09/10/2016   CALCIUM 9.7 05/02/2019   Lab Results  Component Value Date   CHOL 193 05/02/2019   Lab Results  Component Value Date   HDL 51 05/02/2019   Lab Results  Component Value Date   LDLCALC 126 (H) 05/02/2019   Lab Results  Component Value Date   TRIG 66 05/02/2019   Lab Results  Component Value Date   CHOLHDL 3.8 05/02/2019   Lab Results  Component Value Date   HGBA1C 5.7 (H) 05/02/2019      Assessment & Plan:   Problem List Items Addressed This Visit    None    Visit Diagnoses    Erectile dysfunction, unspecified erectile dysfunction type    -  Primary   Relevant Medications   tadalafil (CIALIS) 10 MG tablet    Last visit Norvasc was stopped and started Lisinopril/HCTZ: HTN is stable will continue current regimen. Pt to continue taking home blood pressure keeping log 3 hours after taking medication with goal blood pressure 140/90 or less seeking medical attention for out of range.   Follow low fat/cholesterol diet, 2 gram sodium restriction per day diet  Drink plenty of water daily  Get 20 minutes of exercise at least 4 times per week.   ED: start as needed Cialis follow up as needed, seek urgent medical attention for erection lasting longer than 3 hours.   Meds ordered this encounter  Medications  . tadalafil (CIALIS) 10 MG tablet    Sig: Take one tablet by mouth daily as needed for erectile dysfunction.    Dispense:  30 tablet    Refill:  3    Follow-up: Return if symptoms worsen or fail to improve.    Annie Main, FNP

## 2019-10-25 DIAGNOSIS — N529 Male erectile dysfunction, unspecified: Secondary | ICD-10-CM | POA: Insufficient documentation

## 2019-10-25 NOTE — Patient Instructions (Signed)
Erectile dysfunction, unspecified erectile dysfunction type    -  Primary   Relevant Medications   tadalafil (CIALIS) 10 MG tablet    Last visit Norvasc was stopped and started Lisinopril/HCTZ: HTN is stable will continue current regimen. Pt to continue taking home blood pressure keeping log 3 hours after taking medication with goal blood pressure 140/90 or less seeking medical attention for out of range.   Follow low fat/cholesterol diet, 2 gram sodium restriction per day diet  Drink plenty of water daily  Get 20 minutes of exercise at least 4 times per week.   ED: start as needed Cialis follow up as needed, seek urgent medical attention for erection lasting longer than 3 hours.

## 2019-11-09 ENCOUNTER — Other Ambulatory Visit: Payer: Self-pay | Admitting: Nurse Practitioner

## 2019-11-29 ENCOUNTER — Encounter (HOSPITAL_COMMUNITY): Payer: 59

## 2019-12-02 ENCOUNTER — Other Ambulatory Visit: Payer: Self-pay | Admitting: Nurse Practitioner

## 2019-12-02 NOTE — Telephone Encounter (Signed)
Ok to refill??  Last office visit 10/17/2019.  Last refill 11/09/2019.

## 2019-12-03 ENCOUNTER — Other Ambulatory Visit (HOSPITAL_COMMUNITY): Payer: 59

## 2019-12-27 ENCOUNTER — Ambulatory Visit (INDEPENDENT_AMBULATORY_CARE_PROVIDER_SITE_OTHER): Payer: 59 | Admitting: Family Medicine

## 2019-12-27 ENCOUNTER — Other Ambulatory Visit: Payer: Self-pay

## 2019-12-27 VITALS — BP 110/88 | HR 78 | Temp 98.0°F | Ht 70.0 in | Wt 195.0 lb

## 2019-12-27 DIAGNOSIS — R5382 Chronic fatigue, unspecified: Secondary | ICD-10-CM | POA: Diagnosis not present

## 2019-12-27 DIAGNOSIS — F520 Hypoactive sexual desire disorder: Secondary | ICD-10-CM

## 2019-12-27 DIAGNOSIS — I10 Essential (primary) hypertension: Secondary | ICD-10-CM

## 2019-12-27 DIAGNOSIS — N529 Male erectile dysfunction, unspecified: Secondary | ICD-10-CM

## 2019-12-27 NOTE — Progress Notes (Signed)
Subjective:    Patient ID: Kevin Paul, male    DOB: 08/04/1975, 44 y.o.   MRN: [id number]  HPI  Patient is a very pleasant 44 year old Caucasian male here today complaining of erectile dysfunction.  He states that he is having a difficult time achieving and maintaining erections.  He has noticed this problem getting worse since he started blood pressure medication.  Earlier this year he was switched from amlodipine to lisinopril hydrochlorothiazide.  At that point the problem seemed to get much worse.  He also reports fatigue, decreasing libido, feeling tired.  He questions if his testosterone could be affected.  His total testosterone was checked earlier in the year and was over 500 however his free and bioavailable testosterone has not been evaluated nor has his thyroid.  He is tried Cialis on an as-needed basis however he does not like the lack of spontaneity that this causes.  He has not tried daily Cialis. Past Medical History:  Diagnosis Date  . Cardiomegaly 08/24/2014   [facility]  . COVID-19 virus infection 09/06/2019  . COVID-19 virus infection 09/06/2019  . Hematuria 08/24/2014   exercise induced- [facility]  . HSV-2 infection    Past Surgical History:  Procedure Laterality Date  . ANTERIOR CRUCIATE LIGAMENT REPAIR  07/1994  . KNEE ARTHROSCOPY Bilateral    Has had multiple knee surgeries in his Teens-20s   Current Outpatient Medications on File Prior to Visit  Medication Sig Dispense Refill  . acyclovir (ZOVIRAX) 400 MG tablet TAKE 1 TABLET (400 MG TOTAL) BY MOUTH 3 (THREE) TIMES DAILY. FOR 5 DAYS 15 tablet 5  . lisinopril-hydrochlorothiazide (ZESTORETIC) 10-12.5 MG tablet Take 1 tablet by mouth daily. 90 tablet 1  . simvastatin (ZOCOR) 10 MG tablet TAKE 1 TABLET BY MOUTH EVERYDAY AT BEDTIME 30 tablet 6  . tadalafil (CIALIS) 10 MG tablet Take one tablet by mouth daily as needed for erectile dysfunction. 30 tablet 3  . topiramate (TOPAMAX) 25 MG tablet Take 1 tablet  (25 mg total) by mouth daily. 30 tablet 3  . zolpidem (AMBIEN) 10 MG tablet TAKE 1 TABLET BY MOUTH EVERY DAY AT BEDTIME AS NEEDED FOR SLEEP 25 tablet 0   No current facility-administered medications on file prior to visit.   No Known Allergies Social History   Socioeconomic History  . Marital status: Married    Spouse name: Not on file  . Number of children: Not on file  . Years of education: Not on file  . Highest education level: Not on file  Occupational History  . Not on file  Tobacco Use  . Smoking status: Never Smoker  . Smokeless tobacco: Never Used  Substance and Sexual Activity  . Alcohol use: Yes    Alcohol/week: 4.0 standard drinks    Types: 4 Cans of beer per week  . Drug use: No  . Sexual activity: Yes  Other Topics Concern  . 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   Last year started exercising: Runs 3 miles / day 3-4 days / week.   Also does push-ups, sit ups etc at home.    THIS  INFO  ENTERED  06/22/2013   Social Determinants of Health   Financial Resource Strain:   . Difficulty of Paying Living Expenses: Not on file  Food Insecurity:   . Worried About Estate manager/land agent  of Food in the Last Year: Not on file  . Ran Out of Food in the Last Year: Not on file  Transportation Needs:   . Lack of Transportation (Medical): Not on file  . Lack of Transportation (Non-Medical): Not on file  Physical Activity:   . Days of Exercise per Week: Not on file  . Minutes of Exercise per Session: Not on file  Stress:   . Feeling of Stress : Not on file  Social Connections:   . Frequency of Communication with Friends and Family: Not on file  . Frequency of Social Gatherings with Friends and Family: Not on file  . Attends Religious Services: Not on file  . Active Member of Clubs or Organizations: Not on file  . Attends Archivist Meetings: Not on  file  . Marital Status: Not on file  Intimate Partner Violence:   . Fear of Current or Ex-Partner: Not on file  . Emotionally Abused: Not on file  . Physically Abused: Not on file  . Sexually Abused: Not on file     Review of Systems  All other systems reviewed and are negative.      Objective:   Physical Exam Vitals reviewed.  Constitutional:      Appearance: Normal appearance.  Cardiovascular:     Rate and Rhythm: Normal rate and regular rhythm.     Heart sounds: Normal heart sounds.  Pulmonary:     Effort: Pulmonary effort is normal.     Breath sounds: Normal breath sounds.  Neurological:     Mental Status: He is alert.           Assessment & Plan:  Chronic fatigue - Plan: Testosterone Total,Free,Bio, Males, TSH  Erectile dysfunction, unspecified erectile dysfunction type  Essential hypertension  Decreased sexual desire  I believe this is likely multifactorial.  I believe some of this could be due to blood pressure medication side effects.  Therefore I would like to stop lisinopril/HCTZ and try Bystolic 10 mg a day to see if the patient will have better results.  I will also check his free and bioavailable testosterone levels along with TSH to see if he would benefit from testosterone replacement.  If neither of these options is beneficial, the next step would be trying daily Cialis to see if this would allow spontaneity by having a low dose present in his system every day rather than being used on a as needed basis.  Gave the patient samples of Bystolic to try for 2 weeks first.

## 2019-12-28 ENCOUNTER — Other Ambulatory Visit: Payer: Self-pay

## 2019-12-28 DIAGNOSIS — R519 Headache, unspecified: Secondary | ICD-10-CM

## 2019-12-28 LAB — TESTOSTERONE TOTAL,FREE,BIO, MALES
Albumin: 4.7 g/dL (ref 3.6–5.1)
Sex Hormone Binding: 32 nmol/L (ref 10–50)
Testosterone, Bioavailable: 170.3 ng/dL (ref >=110.0)
Testosterone, Free: 79.5 pg/mL (ref 46.0–224.0)
Testosterone: 569 ng/dL (ref 250–827)

## 2019-12-28 LAB — TSH: TSH: 1.25 mIU/L (ref 0.40–4.50)

## 2019-12-28 MED ORDER — TOPIRAMATE 25 MG PO TABS: 25.0000 mg | ORAL_TABLET | Freq: Every day | ORAL | 3 refills | Status: DC

## 2020-01-04 ENCOUNTER — Other Ambulatory Visit: Payer: Self-pay | Admitting: *Deleted

## 2020-01-04 DIAGNOSIS — R519 Headache, unspecified: Secondary | ICD-10-CM

## 2020-01-04 DIAGNOSIS — R5382 Chronic fatigue, unspecified: Secondary | ICD-10-CM

## 2020-01-04 DIAGNOSIS — I10 Essential (primary) hypertension: Secondary | ICD-10-CM

## 2020-01-04 DIAGNOSIS — G4726 Circadian rhythm sleep disorder, shift work type: Secondary | ICD-10-CM

## 2020-01-04 DIAGNOSIS — E785 Hyperlipidemia, unspecified: Secondary | ICD-10-CM

## 2020-01-10 ENCOUNTER — Other Ambulatory Visit: Payer: Self-pay | Admitting: Family Medicine

## 2020-01-10 MED ORDER — ZOLPIDEM TARTRATE 10 MG PO TABS: ORAL_TABLET | ORAL | 0 refills | Status: DC

## 2020-01-10 NOTE — Telephone Encounter (Signed)
Received fax from [facility]. Requesting a refill on zolpidem (AMBIEN) 10 MG tablet SIG: TAKE 1 TABLET BY MOUTH EVERY DAY AT BEDTIME AS NEEDED FOR SLEEP

## 2020-01-10 NOTE — Telephone Encounter (Signed)
Ok to refill??  Last office visit 12/27/2019.  Last refill 12/05/2019.

## 2020-01-12 ENCOUNTER — Other Ambulatory Visit: Payer: Self-pay | Admitting: Family Medicine

## 2020-01-12 ENCOUNTER — Telehealth: Payer: Self-pay

## 2020-01-12 MED ORDER — NEBIVOLOL HCL 10 MG PO TABS: 10.0000 mg | ORAL_TABLET | Freq: Every day | ORAL | 3 refills | Status: DC

## 2020-01-12 NOTE — Telephone Encounter (Signed)
I will send in Bystolic 10 mg a day.

## 2020-01-12 NOTE — Telephone Encounter (Signed)
Kevin Paul would like to move forward with taking the Bystolic he had as samples.

## 2020-01-12 NOTE — Telephone Encounter (Signed)
Pt notified about rx sent to pharmacy

## 2020-01-16 ENCOUNTER — Other Ambulatory Visit: Payer: Self-pay | Admitting: Family Medicine

## 2020-01-16 ENCOUNTER — Telehealth: Payer: Self-pay

## 2020-01-16 NOTE — Telephone Encounter (Signed)
Generic beta blockers are the worst about causing ED.  If he is willing, try amlodipine 10 mg daily instead.

## 2020-01-16 NOTE — Patient Instructions (Addendum)
DUE TO COVID-19 ONLY ONE VISITOR IS ALLOWED TO COME WITH YOU AND STAY IN THE WAITING ROOM ONLY DURING PRE OP AND PROCEDURE.   IF YOU WILL BE ADMITTED INTO THE HOSPITAL YOU ARE ALLOWED ONE SUPPORT PERSON DURING VISITATION HOURS ONLY (10AM -8PM)   . The support person may change daily. . The support person must pass our screening, gel in and out, and wear a mask at all times, including in the patient's room. . Patients must also wear a mask when staff or their support person are in the room.   COVID SWAB TESTING MUST BE COMPLETED ON:  Saturday, 01-21-20 @ 12:00 PM   [address] [place], [place] [address]  (Must self quarantine after testing. Follow instructions on handout.)    Your procedure is scheduled on:  Wednesday, 01-25-20   Report to [facility] Main  Entrance    Report to admitting at 6:30 AM   Call this number if you have problems the morning of surgery [phone]   Do not eat food :After Midnight.   May have liquids until 5:30 AM  day of surgery  CLEAR LIQUID DIET  Foods Allowed                                                                     Foods Excluded  Water, Black Coffee and tea, regular and decaf               liquids that you cannot  Plain Jell-O in any flavor  (No red)                                      see through such as: Fruit ices (not with fruit pulp)                                      milk, soups, orange juice              Iced Popsicles (No red)                                      All solid food                                   Apple juices Sports drinks like Gatorade (No red) Lightly seasoned clear broth or consume(fat free) Sugar, honey syrup   Oral Hygiene is also important to reduce your risk of infection.                                    Remember - BRUSH YOUR TEETH THE MORNING OF SURGERY WITH YOUR REGULAR TOOTHPASTE   Do NOT smoke after Midnight   Take these medicines the morning of surgery with A SIP OF WATER:  Amlodipine  You may not have any metal on your body including  jewelry, and body piercings             Do not wear lotions, powders, perfumes/cologne, or deodorant             Men may shave face and neck.   Do not bring valuables to the hospital. [place].   Contacts, dentures or bridgework may not be worn into surgery.      Patients discharged the day of surgery will not be allowed to drive home.               Please read over the following fact sheets you were given: IF YOU HAVE QUESTIONS ABOUT YOUR PRE OP INSTRUCTIONS PLEASE CALL [phone]   [facility] - Preparing for Surgery Before surgery, you can play an important role.  Because skin is not sterile, your skin needs to be as free of germs as possible.  You can reduce the number of germs on your skin by washing with CHG (chlorahexidine gluconate) soap before surgery.  CHG is an antiseptic cleaner which kills germs and bonds with the skin to continue killing germs even after washing. Please DO NOT use if you have an allergy to CHG or antibacterial soaps.  If your skin becomes reddened/irritated stop using the CHG and inform your nurse when you arrive at Short Stay. Do not shave (including legs and underarms) for at least 48 hours prior to the first CHG shower.  You may shave your face/neck.  Please follow these instructions carefully:  1.  Shower with CHG Soap the night before surgery and the  morning of surgery.  2.  If you choose to wash your hair, wash your hair first as usual with your normal  shampoo.  3.  After you shampoo, rinse your hair and body thoroughly to remove the shampoo.                             4.  Use CHG as you would any other liquid soap.  You can apply chg directly to the skin and wash.  Gently with a scrungie or clean washcloth.  5.  Apply the CHG Soap to your body ONLY FROM THE NECK DOWN.   Do   not use on face/ open                            Wound or open sores. Avoid contact with eyes, ears mouth and   genitals (private parts).                       Wash face,  Genitals (private parts) with your normal soap.             6.  Wash thoroughly, paying special attention to the area where your    surgery  will be performed.  7.  Thoroughly rinse your body with warm water from the neck down.  8.  DO NOT shower/wash with your normal soap after using and rinsing off the CHG Soap.                9.  Pat yourself dry with a clean towel.            10.  Wear clean pajamas.  11.  Place clean sheets on your bed the night of your first shower and do not  sleep with pets. Day of Surgery : Do not apply any lotions/deodorants the morning of surgery.  Please wear clean clothes to the hospital/surgery center.  FAILURE TO FOLLOW THESE INSTRUCTIONS MAY RESULT IN THE CANCELLATION OF YOUR SURGERY  PATIENT SIGNATURE_________________________________  NURSE SIGNATURE__________________________________  ________________________________________________________________________

## 2020-01-16 NOTE — Telephone Encounter (Signed)
Pt is asking for generic prescription, his insurance will not cover brand Bystolic

## 2020-01-16 NOTE — Progress Notes (Addendum)
COVID Vaccine Completed:   x2 Date COVID Vaccine completed:  2nd dose February 2021 COVID vaccine manufacturer: [place]   PCP - Jenna Luo, MD Cardiologist -   Chest x-ray -  EKG - 01-18-20 in Epic  Stress Test -  ECHO -  Cardiac Cath -  Pacemaker/ICD device last checked:  Sleep Study -   CPAP -   Fasting Blood Sugar -  Checks Blood Sugar _____ times a day  Blood Thinner Instructions: Aspirin Instructions: Last Dose:  Anesthesia review:  Possible cardiomegaly per US done at Dr. Saralyn Pilar Meckenzie's office in 2016.  Advised to have workup if patient wanted to, pt decided against workup.  Denies chest pain, SOB.     Patient denies shortness of breath, fever, cough and chest pain at PAT appointment   Patient verbalized understanding of instructions that were given to them at the PAT appointment. Patient was also instructed that they will need to review over the PAT instructions again at home before surgery.

## 2020-01-17 ENCOUNTER — Other Ambulatory Visit: Payer: Self-pay | Admitting: Family Medicine

## 2020-01-17 MED ORDER — AMLODIPINE BESYLATE 10 MG PO TABS: 10.0000 mg | ORAL_TABLET | Freq: Every day | ORAL | 3 refills | Status: DC

## 2020-01-17 NOTE — Telephone Encounter (Signed)
?   Change in medication

## 2020-01-17 NOTE — Telephone Encounter (Signed)
I want to switch him to amlodipine 10 mg a day.

## 2020-01-18 ENCOUNTER — Other Ambulatory Visit: Payer: Self-pay

## 2020-01-18 ENCOUNTER — Encounter (HOSPITAL_COMMUNITY)
Admission: RE | Admit: 2020-01-18 | Discharge: 2020-01-18 | Disposition: A | Discharge status: Home / Self Care | Payer: 59 | Source: Ambulatory Visit | Attending: Surgery | Admitting: Surgery

## 2020-01-18 ENCOUNTER — Encounter (HOSPITAL_COMMUNITY): Payer: Self-pay

## 2020-01-18 DIAGNOSIS — I10 Essential (primary) hypertension: Secondary | ICD-10-CM | POA: Diagnosis not present

## 2020-01-18 DIAGNOSIS — Z01818 Encounter for other preprocedural examination: Secondary | ICD-10-CM | POA: Diagnosis not present

## 2020-01-18 HISTORY — DX: Other specified postprocedural states: Z98.890

## 2020-01-18 HISTORY — DX: Pneumonia, unspecified organism: J18.9

## 2020-01-18 HISTORY — DX: Nausea with vomiting, unspecified: R11.2

## 2020-01-18 HISTORY — DX: Essential (primary) hypertension: I10

## 2020-01-18 LAB — CBC WITH DIFFERENTIAL/PLATELET
Abs Immature Granulocytes: 0.02 10*3/uL (ref 0.00–0.07)
Basophils Absolute: 0 10*3/uL (ref 0.0–0.1)
Basophils Relative: 0 %
Eosinophils Absolute: 0.3 10*3/uL (ref 0.0–0.5)
Eosinophils Relative: 5 %
HCT: 44.7 % (ref 39.0–52.0)
Hemoglobin: 15.3 g/dL (ref 13.0–17.0)
Immature Granulocytes: 0 %
Lymphocytes Relative: 27 %
Lymphs Abs: 1.5 10*3/uL (ref 0.7–4.0)
MCH: 31.7 pg (ref 26.0–34.0)
MCHC: 34.2 g/dL (ref 30.0–36.0)
MCV: 92.5 fL (ref 80.0–100.0)
Monocytes Absolute: 0.4 10*3/uL (ref 0.1–1.0)
Monocytes Relative: 8 %
Neutro Abs: 3.3 10*3/uL (ref 1.7–7.7)
Neutrophils Relative %: 60 %
Platelets: 237 10*3/uL (ref 150–400)
RBC: 4.83 MIL/uL (ref 4.22–5.81)
RDW: 12.5 % (ref 11.5–15.5)
WBC: 5.6 10*3/uL (ref 4.0–10.5)
nRBC: 0 % (ref 0.0–0.2)

## 2020-01-18 LAB — COMPREHENSIVE METABOLIC PANEL
ALT: 25 U/L (ref 0–44)
AST: 20 U/L (ref 15–41)
Albumin: 4.4 g/dL (ref 3.5–5.0)
Alkaline Phosphatase: 28 U/L — ABNORMAL LOW (ref 38–126)
Anion gap: 7 (ref 5–15)
BUN: 16 mg/dL (ref 6–20)
CO2: 26 mmol/L (ref 22–32)
Calcium: 9.3 mg/dL (ref 8.9–10.3)
Chloride: 106 mmol/L (ref 98–111)
Creatinine, Ser: 1.07 mg/dL (ref 0.61–1.24)
GFR, Estimated: >60 mL/min (ref >60)
Glucose, Bld: 109 mg/dL — ABNORMAL HIGH (ref 70–99)
Potassium: 4.6 mmol/L (ref 3.5–5.1)
Sodium: 139 mmol/L (ref 135–145)
Total Bilirubin: 0.8 mg/dL (ref 0.3–1.2)
Total Protein: 7.3 g/dL (ref 6.5–8.1)

## 2020-01-18 LAB — APTT: aPTT: 28 seconds (ref 24–36)

## 2020-01-18 LAB — PROTIME-INR
INR: 1 (ref 0.8–1.2)
Prothrombin Time: 12.3 seconds (ref 11.4–15.2)

## 2020-01-19 LAB — HEMOGLOBIN A1C
Hgb A1c MFr Bld: 5.8 % — ABNORMAL HIGH (ref 4.8–5.6)
Mean Plasma Glucose: 120 mg/dL

## 2020-01-21 ENCOUNTER — Other Ambulatory Visit (HOSPITAL_COMMUNITY)
Admission: RE | Admit: 2020-01-21 | Discharge: 2020-01-21 | Disposition: A | Discharge status: Home / Self Care | Payer: 59 | Source: Ambulatory Visit | Attending: Surgery | Admitting: Surgery

## 2020-01-21 DIAGNOSIS — Z20822 Contact with and (suspected) exposure to covid-19: Secondary | ICD-10-CM | POA: Diagnosis not present

## 2020-01-21 DIAGNOSIS — Z01812 Encounter for preprocedural laboratory examination: Secondary | ICD-10-CM | POA: Diagnosis not present

## 2020-01-22 LAB — SARS CORONAVIRUS 2 (TAT 6-24 HRS): SARS Coronavirus 2: NEGATIVE

## 2020-01-24 MED ORDER — BUPIVACAINE LIPOSOME 1.3 % IJ SUSP
20.0000 mL | INTRAMUSCULAR | Status: DC
  Filled 2020-01-24: qty 20

## 2020-01-24 NOTE — Telephone Encounter (Signed)
Sent message thru [facility]

## 2020-01-24 NOTE — Telephone Encounter (Signed)
Please contact the patient and tel him to try amlodipine instead of atenolol since bystolic is not covered.

## 2020-01-25 ENCOUNTER — Ambulatory Visit (HOSPITAL_COMMUNITY): Payer: 59 | Admitting: Emergency Medicine

## 2020-01-25 ENCOUNTER — Encounter (HOSPITAL_COMMUNITY)
Admission: RE | Disposition: A | Discharge status: Home / Self Care | Payer: Self-pay | Source: Ambulatory Visit | Attending: Surgery

## 2020-01-25 ENCOUNTER — Ambulatory Visit (HOSPITAL_COMMUNITY): Payer: 59 | Admitting: Certified Registered Nurse Anesthetist

## 2020-01-25 ENCOUNTER — Encounter (HOSPITAL_COMMUNITY): Payer: Self-pay | Admitting: Surgery

## 2020-01-25 ENCOUNTER — Ambulatory Visit (HOSPITAL_COMMUNITY)
Admission: RE | Admit: 2020-01-25 | Discharge: 2020-01-25 | Disposition: A | Discharge status: Home / Self Care | Payer: 59 | Source: Ambulatory Visit | Attending: Surgery | Admitting: Surgery

## 2020-01-25 DIAGNOSIS — K436 Other and unspecified ventral hernia with obstruction, without gangrene: Secondary | ICD-10-CM | POA: Diagnosis not present

## 2020-01-25 DIAGNOSIS — Z8616 Personal history of COVID-19: Secondary | ICD-10-CM | POA: Diagnosis not present

## 2020-01-25 DIAGNOSIS — K439 Ventral hernia without obstruction or gangrene: Secondary | ICD-10-CM | POA: Diagnosis present

## 2020-01-25 HISTORY — PX: VENTRAL HERNIA REPAIR: SHX424

## 2020-01-25 SURGERY — REPAIR, HERNIA, VENTRAL
Anesthesia: General | Site: Abdomen

## 2020-01-25 MED ORDER — AMISULPRIDE (ANTIEMETIC) 5 MG/2ML IV SOLN: 10.0000 mg | Freq: Once | INTRAVENOUS | Status: DC | PRN

## 2020-01-25 MED ORDER — ACETAMINOPHEN 500 MG PO TABS: 1000.0000 mg | ORAL_TABLET | ORAL | Status: AC

## 2020-01-25 MED ORDER — ROCURONIUM BROMIDE 10 MG/ML (PF) SYRINGE
PREFILLED_SYRINGE | INTRAVENOUS | Status: AC
  Filled 2020-01-25: qty 10

## 2020-01-25 MED ORDER — FENTANYL CITRATE (PF) 250 MCG/5ML IJ SOLN
INTRAMUSCULAR | Status: AC
  Filled 2020-01-25: qty 5

## 2020-01-25 MED ORDER — PROPOFOL 10 MG/ML IV BOLUS
INTRAVENOUS | Status: DC | PRN
  Administered 2020-01-25: 200 mg via INTRAVENOUS

## 2020-01-25 MED ORDER — ONDANSETRON HCL 4 MG/2ML IJ SOLN
INTRAMUSCULAR | Status: DC | PRN
  Administered 2020-01-25 (×2): 4 mg via INTRAVENOUS

## 2020-01-25 MED ORDER — ONDANSETRON HCL 4 MG/2ML IJ SOLN: 4.0000 mg | Freq: Once | INTRAMUSCULAR | Status: DC | PRN

## 2020-01-25 MED ORDER — DEXMEDETOMIDINE (PRECEDEX) IN NS 20 MCG/5ML (4 MCG/ML) IV SYRINGE
PREFILLED_SYRINGE | INTRAVENOUS | Status: DC | PRN
  Administered 2020-01-25 (×2): 4 ug via INTRAVENOUS

## 2020-01-25 MED ORDER — MIDAZOLAM HCL 5 MG/5ML IJ SOLN
INTRAMUSCULAR | Status: DC | PRN
  Administered 2020-01-25: 2 mg via INTRAVENOUS

## 2020-01-25 MED ORDER — FENTANYL CITRATE (PF) 100 MCG/2ML IJ SOLN: 25.0000 ug | INTRAMUSCULAR | Status: DC | PRN

## 2020-01-25 MED ORDER — SODIUM CHLORIDE (PF) 0.9 % IJ SOLN
INTRAMUSCULAR | Status: AC
  Filled 2020-01-25: qty 20

## 2020-01-25 MED ORDER — KETAMINE HCL 10 MG/ML IJ SOLN
INTRAMUSCULAR | Status: DC | PRN
  Administered 2020-01-25 (×4): 10 mg via INTRAVENOUS

## 2020-01-25 MED ORDER — ROCURONIUM BROMIDE 100 MG/10ML IV SOLN
INTRAVENOUS | Status: DC | PRN
  Administered 2020-01-25: 60 mg via INTRAVENOUS

## 2020-01-25 MED ORDER — ORAL CARE MOUTH RINSE: 15.0000 mL | Freq: Once | OROMUCOSAL | Status: AC

## 2020-01-25 MED ORDER — LIDOCAINE 2% (20 MG/ML) 5 ML SYRINGE
INTRAMUSCULAR | Status: DC | PRN
  Administered 2020-01-25: 60 mg via INTRAVENOUS

## 2020-01-25 MED ORDER — CEFAZOLIN SODIUM-DEXTROSE 2-4 GM/100ML-% IV SOLN
2.0000 g | INTRAVENOUS | Status: AC
  Administered 2020-01-25: 2 g via INTRAVENOUS

## 2020-01-25 MED ORDER — KETAMINE HCL 10 MG/ML IJ SOLN
INTRAMUSCULAR | Status: AC
  Filled 2020-01-25: qty 1

## 2020-01-25 MED ORDER — ACETAMINOPHEN 500 MG PO TABS
ORAL_TABLET | ORAL | Status: AC
  Administered 2020-01-25: 1000 mg via ORAL
  Filled 2020-01-25: qty 2

## 2020-01-25 MED ORDER — CEFAZOLIN SODIUM-DEXTROSE 2-4 GM/100ML-% IV SOLN
INTRAVENOUS | Status: AC
  Filled 2020-01-25: qty 100

## 2020-01-25 MED ORDER — BUPIVACAINE-EPINEPHRINE 0.5% -1:200000 IJ SOLN
INTRAMUSCULAR | Status: DC | PRN
  Administered 2020-01-25: 15 mL

## 2020-01-25 MED ORDER — CHLORHEXIDINE GLUCONATE CLOTH 2 % EX PADS: 6.0000 | MEDICATED_PAD | Freq: Once | CUTANEOUS | Status: DC

## 2020-01-25 MED ORDER — BUPIVACAINE LIPOSOME 1.3 % IJ SUSP
INTRAMUSCULAR | Status: DC | PRN
  Administered 2020-01-25: 20 mL

## 2020-01-25 MED ORDER — ONDANSETRON HCL 4 MG/2ML IJ SOLN
INTRAMUSCULAR | Status: AC
  Filled 2020-01-25: qty 2

## 2020-01-25 MED ORDER — SUGAMMADEX SODIUM 200 MG/2ML IV SOLN
INTRAVENOUS | Status: DC | PRN
  Administered 2020-01-25: 200 mg via INTRAVENOUS

## 2020-01-25 MED ORDER — BUPIVACAINE-EPINEPHRINE (PF) 0.5% -1:200000 IJ SOLN
INTRAMUSCULAR | Status: AC
  Filled 2020-01-25: qty 30

## 2020-01-25 MED ORDER — DIPHENHYDRAMINE HCL 50 MG/ML IJ SOLN
INTRAMUSCULAR | Status: AC
  Filled 2020-01-25: qty 1

## 2020-01-25 MED ORDER — SODIUM CHLORIDE (PF) 0.9 % IJ SOLN
INTRAMUSCULAR | Status: DC | PRN
  Administered 2020-01-25: 15 mL

## 2020-01-25 MED ORDER — DIPHENHYDRAMINE HCL 50 MG/ML IJ SOLN
INTRAMUSCULAR | Status: DC | PRN
  Administered 2020-01-25: 12.5 mg via INTRAVENOUS

## 2020-01-25 MED ORDER — OXYCODONE HCL 5 MG/5ML PO SOLN: 5.0000 mg | Freq: Once | ORAL | Status: DC | PRN

## 2020-01-25 MED ORDER — LIDOCAINE 2% (20 MG/ML) 5 ML SYRINGE
INTRAMUSCULAR | Status: AC
  Filled 2020-01-25: qty 5

## 2020-01-25 MED ORDER — FENTANYL CITRATE (PF) 100 MCG/2ML IJ SOLN
INTRAMUSCULAR | Status: DC | PRN
  Administered 2020-01-25 (×3): 50 ug via INTRAVENOUS

## 2020-01-25 MED ORDER — LIDOCAINE 2% (20 MG/ML) 5 ML SYRINGE
INTRAMUSCULAR | Status: AC
  Filled 2020-01-25: qty 10

## 2020-01-25 MED ORDER — LACTATED RINGERS IV SOLN: INTRAVENOUS | Status: DC

## 2020-01-25 MED ORDER — PROPOFOL 500 MG/50ML IV EMUL
INTRAVENOUS | Status: AC
  Filled 2020-01-25: qty 50

## 2020-01-25 MED ORDER — DEXAMETHASONE SODIUM PHOSPHATE 10 MG/ML IJ SOLN
INTRAMUSCULAR | Status: DC | PRN
  Administered 2020-01-25: 10 mg via INTRAVENOUS

## 2020-01-25 MED ORDER — OXYCODONE HCL 5 MG PO TABS: 5.0000 mg | ORAL_TABLET | Freq: Once | ORAL | Status: DC | PRN

## 2020-01-25 MED ORDER — 0.9 % SODIUM CHLORIDE (POUR BTL) OPTIME
TOPICAL | Status: DC | PRN
  Administered 2020-01-25: 1000 mL

## 2020-01-25 MED ORDER — LIDOCAINE HCL (PF) 2 % IJ SOLN
INTRAMUSCULAR | Status: DC | PRN
  Administered 2020-01-25: 1.5 mg/kg/h via INTRADERMAL

## 2020-01-25 MED ORDER — MIDAZOLAM HCL 2 MG/2ML IJ SOLN
INTRAMUSCULAR | Status: AC
  Filled 2020-01-25: qty 2

## 2020-01-25 MED ORDER — KETOROLAC TROMETHAMINE 30 MG/ML IJ SOLN
INTRAMUSCULAR | Status: AC
  Filled 2020-01-25: qty 1

## 2020-01-25 MED ORDER — KETOROLAC TROMETHAMINE 30 MG/ML IJ SOLN
INTRAMUSCULAR | Status: DC | PRN
  Administered 2020-01-25: 30 mg via INTRAVENOUS

## 2020-01-25 MED ORDER — DEXAMETHASONE SODIUM PHOSPHATE 10 MG/ML IJ SOLN
INTRAMUSCULAR | Status: AC
  Filled 2020-01-25: qty 1

## 2020-01-25 MED ORDER — TRAMADOL HCL 50 MG PO TABS
50.0000 mg | ORAL_TABLET | Freq: Four times a day (QID) | ORAL | 0 refills | Status: AC | PRN
Start: 2020-01-25 — End: 2020-01-30

## 2020-01-25 MED ORDER — CHLORHEXIDINE GLUCONATE 0.12 % MT SOLN
15.0000 mL | Freq: Once | OROMUCOSAL | Status: AC
  Administered 2020-01-25: 15 mL via OROMUCOSAL

## 2020-01-25 MED ORDER — PROPOFOL 10 MG/ML IV BOLUS
INTRAVENOUS | Status: AC
  Filled 2020-01-25: qty 40

## 2020-01-25 MED ORDER — PROPOFOL 500 MG/50ML IV EMUL
INTRAVENOUS | Status: DC | PRN
  Administered 2020-01-25: 50 ug/kg/min via INTRAVENOUS

## 2020-01-25 SURGICAL SUPPLY — 26 items
ADH SKN CLS APL DERMABOND .7 (GAUZE/BANDAGES/DRESSINGS) ×1
APL PRP STRL LF DISP 70% ISPRP (MISCELLANEOUS) ×1
CHLORAPREP W/TINT 26 (MISCELLANEOUS) ×3 IMPLANT
COVER SURGICAL LIGHT HANDLE (MISCELLANEOUS) ×3 IMPLANT
COVER WAND RF STERILE (DRAPES) IMPLANT
DECANTER SPIKE VIAL GLASS SM (MISCELLANEOUS) ×3 IMPLANT
DERMABOND ADVANCED (GAUZE/BANDAGES/DRESSINGS) ×2
DERMABOND ADVANCED .7 DNX12 (GAUZE/BANDAGES/DRESSINGS) ×1 IMPLANT
DRAPE LAPAROSCOPIC ABDOMINAL (DRAPES) ×3 IMPLANT
GLOVE BIO SURGEON STRL SZ7.5 (GLOVE) ×3 IMPLANT
GLOVE INDICATOR 8.0 STRL GRN (GLOVE) ×3 IMPLANT
GOWN STRL REUS W/TWL XL LVL3 (GOWN DISPOSABLE) ×6 IMPLANT
KIT BASIN OR (CUSTOM PROCEDURE TRAY) ×3 IMPLANT
KIT TURNOVER KIT A (KITS) ×2 IMPLANT
NEEDLE HYPO 22GX1.5 SAFETY (NEEDLE) ×2 IMPLANT
NS IRRIG 1000ML POUR BTL (IV SOLUTION) ×3 IMPLANT
PACK GENERAL/GYN (CUSTOM PROCEDURE TRAY) ×3 IMPLANT
PENCIL SMOKE EVACUATOR (MISCELLANEOUS) ×2 IMPLANT
SPONGE LAP 18X18 RF (DISPOSABLE) IMPLANT
SUT ETHIBOND 0 MO6 C/R (SUTURE) ×3 IMPLANT
SUT MNCRL AB 4-0 PS2 18 (SUTURE) ×2 IMPLANT
SUT VIC AB 3-0 SH 27 (SUTURE) ×3
SUT VIC AB 3-0 SH 27X BRD (SUTURE) IMPLANT
SYR 20ML LL LF (SYRINGE) ×2 IMPLANT
TOWEL OR 17X26 10 PK STRL BLUE (TOWEL DISPOSABLE) ×3 IMPLANT
TOWEL OR NON WOVEN STRL DISP B (DISPOSABLE) ×3 IMPLANT

## 2020-01-25 NOTE — Anesthesia Procedure Notes (Signed)
Procedure Name: Intubation Date/Time: 01/25/2020 8:34 AM Performed by: Gwyndolyn Saxon, CRNA Pre-anesthesia Checklist: Patient identified, Emergency Drugs available, Suction available and Patient being monitored Patient Re-evaluated:Patient Re-evaluated prior to induction Oxygen Delivery Method: Circle system utilized Preoxygenation: Pre-oxygenation with 100% oxygen Induction Type: IV induction Ventilation: Mask ventilation without difficulty Laryngoscope Size: Miller and 2 Grade View: Grade II Tube type: Oral Tube size: 7.5 mm Number of attempts: 1 Airway Equipment and Method: Patient positioned with wedge pillow and Stylet Placement Confirmation: ETT inserted through vocal cords under direct vision,  positive ETCO2 and breath sounds checked- equal and bilateral Secured at: 23 cm Tube secured with: Tape Dental Injury: Teeth and Oropharynx as per pre-operative assessment

## 2020-01-25 NOTE — H&P (Signed)
CC: Here today for surgery  HPI: Kevin Paul is a very pleasant [phone] with no known medical issues who presents for evaluation of a supraumbilical bulge that he is needed since he is a child. He reports he had some discomfort related to this twice and his adult life. However, beginning 3 weeks ago, he began having significant constant pain that lasted approximately 2-3 days and is located exactly where this bulge is present. He denies any skin changes. He denied any issues with nausea/vomiting. Denied any changes in his bowels. He reported that the pain was made worse by bending over or pushing on it. Over the ensuing couple of days has also subsided and has not completely resolved. He reports he is told as a child that he had a small hernia there and could be fixed for cosmetic reasons or if he develops symptoms so he has opted to observe it until now. He reports he has only had left knee surgery, no prior abdominal surgical history  He denies any changes in his health or health history since we met. He had an ultrasound confriming a fat containing hernia at this location. Intermittently remains symptomatic.  PMH: Denies  PSH: He denies any prior abdominal surgical history  FHx: Denies FHx of colorectal, breast, endometrial, ovarian or cervical cancer  Social: Denies use of tobacco/EtOH/drugs. He works as a Audiological scientist with [employer]  ROS: A comprehensive 10 system review of systems was completed with the patient and pertinent findings as noted above.  Past Medical History:  Diagnosis Date  . Cardiomegaly 08/24/2014   [facility]  . COVID-19 virus infection 09/06/2019  . COVID-19 virus infection 09/06/2019  . Hematuria 08/24/2014   exercise induced- [facility]  . HSV-2 infection   . Hypertension   . Pneumonia    10 years ago  . PONV (postoperative nausea and vomiting)     Past Surgical History:  Procedure Laterality Date  . ANTERIOR CRUCIATE LIGAMENT REPAIR   07/1994  . KNEE ARTHROSCOPY Bilateral    Has had multiple knee surgeries in his Teens-20s  . WISDOM TOOTH EXTRACTION      Family History  Problem Relation Age of Onset  . Hyperlipidemia Father   . Hypertension Father   . Heart disease Father 31       MI--stent  . Stroke Paternal Grandfather 72       Stroke  . Colon polyps Neg Hx   . Esophageal cancer Neg Hx   . Inflammatory bowel disease Neg Hx   . Liver disease Neg Hx   . Pancreatic cancer Neg Hx   . Rectal cancer Neg Hx   . Stomach cancer Neg Hx     Social:  reports that he has never smoked. He has never used smokeless tobacco. He reports current alcohol use of about 4.0 standard drinks of alcohol per week. He reports that he does not use drugs.  Allergies: No Known Allergies  Medications: I have reviewed the patient's current medications.  No results found for this or any previous visit (from the past 48 hour(s)).  No results found.  ROS - all of the below systems have been reviewed with the patient and positives are indicated with bold text General: chills, fever or night sweats Eyes: blurry vision or double vision ENT: epistaxis or sore throat Allergy/Immunology: itchy/watery eyes or nasal congestion Hematologic/Lymphatic: bleeding problems, blood clots or swollen lymph nodes Endocrine: temperature intolerance or unexpected weight changes Breast: new or changing breast lumps or nipple  discharge Resp: cough, shortness of breath, or wheezing CV: chest pain or dyspnea on exertion GI: as per HPI GU: dysuria, trouble voiding, or hematuria MSK: joint pain or joint stiffness Neuro: TIA or stroke symptoms Derm: pruritus and skin lesion changes Psych: anxiety and depression  PE Blood pressure 139/88, pulse 72, temperature 97.9 F (36.6 C), temperature source Oral, resp. rate 16, SpO2 100 %. Constitutional: NAD; conversant Eyes: Moist conjunctiva; no lid lag Lungs: Normal respiratory effort CV: RRR; no palpable  thrills; no pitting edema GI: Abd soft, NT/ND; palpable bulge incarcerated in supraumbilical location; no skin changes or tenderness; no palpable hepatosplenomegaly MSK: Normal range of motion of extremities; no clubbing/cyanosis Psychiatric: Appropriate affect; alert and oriented x3  No results found for this or any previous visit (from the past 48 hour(s)).  No results found.   A/P: Kevin Paul is a very pleasant 09UEA with supraumbilical bulge of abdominal wall - previously minimally symptomatic but had significant episode now of pain 3 weeks ago - previously told he had hernia here and is requesting having this repaired  -Given his significant symptoms related to this, he is interested in pursuing surgery. We again reviewed open ventral hernia repair with possible placement of mesh based on size of defect intraoperatively. We discussed scenarios where mesh may be placed including for defects >1-2 cm in size. -The planned procedure, material risks (including, but not limited to, pain, bleeding, infection, scarring, need for blood transfusion, damage to surrounding structures-blood vessels/nerves/viscus/organs, need for additional procedures, recurrence of hernia, chronic pain, mesh complications including erosion into other structures/vessels/organs/viscus, pneumonia, heart attack, stroke, death) benefits and alternatives to surgery were discussed at length. I noted a good probability that the procedure help improve his symptoms. The patient's questions were answered to his satisfaction, he voiced understanding and they elected to proceed with surgery. Additionally, we discussed typical postoperative expectations and the recovery process, lifting restrictions for 6 weeks - nothing more than 10 lbs as well.  Sharon Mt. Dema Severin, M.D. [facility] Use [email or web address] to contact on call provider

## 2020-01-25 NOTE — Anesthesia Preprocedure Evaluation (Addendum)
Anesthesia Evaluation  Patient identified by MRN, date of birth, ID band Patient awake    Reviewed: Allergy & Precautions, NPO status , Patient's Chart, lab work & pertinent test results  History of Anesthesia Complications Negative for: history of anesthetic complications  Airway Mallampati: II  TM Distance: >3 FB Neck ROM: Full    Dental  (+) Teeth Intact   Pulmonary neg pulmonary ROS,    Pulmonary exam normal        Cardiovascular hypertension, Pt. on medications Normal cardiovascular exam     Neuro/Psych negative neurological ROS  negative psych ROS   GI/Hepatic Neg liver ROS, GERD  ,  Endo/Other  negative endocrine ROS  Renal/GU negative Renal ROS  negative genitourinary   Musculoskeletal negative musculoskeletal ROS (+)   Abdominal   Peds  Hematology negative hematology ROS (+)   Anesthesia Other Findings  COVID infection June 2021  Reproductive/Obstetrics                            Anesthesia Physical Anesthesia Plan  ASA: II  Anesthesia Plan: General   Post-op Pain Management:    Induction: Intravenous  PONV Risk Score and Plan: 3 and Ondansetron, Dexamethasone, Treatment may vary due to age or medical condition and Midazolam  Airway Management Planned: Oral ETT  Additional Equipment: None  Intra-op Plan:   Post-operative Plan: Extubation in OR  Informed Consent: I have reviewed the patients History and Physical, chart, labs and discussed the procedure including the risks, benefits and alternatives for the proposed anesthesia with the patient or authorized representative who has indicated his/her understanding and acceptance.     Dental advisory given  Plan Discussed with:   Anesthesia Plan Comments:        Anesthesia Quick Evaluation

## 2020-01-25 NOTE — Transfer of Care (Signed)
Immediate Anesthesia Transfer of Care Note  Patient: Decl[facility]  Procedure(s) Performed: open primary supraumbilical ventral hernia repair (N/A Abdomen)  Patient Location: PACU  Anesthesia Type:General  Level of Consciousness: drowsy  Airway & Oxygen Therapy: Patient Spontanous Breathing and Patient connected to face mask oxygen  Post-op Assessment: Report given to RN and Post -op Vital signs reviewed and stable  Post vital signs: Reviewed and stable  Last Vitals:  Vitals Value Taken Time  BP 99/63 01/25/20 0930  Temp    Pulse 63 01/25/20 0932  Resp 14 01/25/20 0932  SpO2 97 % 01/25/20 0932  Vitals shown include unvalidated device data.  Last Pain:  Vitals:   01/25/20 0652  TempSrc: Oral         Complications: No complications documented.

## 2020-01-25 NOTE — Op Note (Addendum)
01/25/2020  9:18 AM  PATIENT:  Kevin Paul  44 y.o. male  Patient Care Team: Susy Frizzle, MD as PCP - General (Family Medicine)  PRE-OPERATIVE DIAGNOSIS:  Symptomatic primary ventral hernia  POST-OPERATIVE DIAGNOSIS:  Same  PROCEDURE:  Open ventral hernia repair  SURGEON:  Sharon Mt. Dema Severin, MD  ASSISTANT: Sheria Lang, MD  ANESTHESIA:   local and general  COUNTS:  Sponge, needle and instrument counts were reported correct x2 at the conclusion of the operation.  EBL: 3 mL  DRAINS: None  SPECIMEN: Hernia sac/adipose tissue  COMPLICATIONS: None  FINDINGS: Supraumbilical ventral hernia containing fat - primarily preperitoneal fat. This had mushroomed out and was chronically incarcerated but viable. Defect was 1.5 cm in size. This was amenable to a tension free primary repair  DISPOSITION: PACU in satisfactory condition  INDICATION: [name] is a very pleasant 44 yo male whom was seen in the office for a near lifetime history of epigastric hernia that is primary. It has become increasingly symptomatic. It was confirmed on ultrasound. We had reviewed options going forward and he opted to pursue surgery.  Please refer to notes elsewhere for details regarding this discussion  DESCRIPTION: The patient was identified in preop holding and taken to the OR where he was placed on the operating room table. SCDs were placed. General endotracheal anesthesia was induced without difficulty. Hair on the abdomen was clipped by the OR team. Pressure points were evaluated and padded. He was then prepped and draped in the usual sterile fashion. A surgical timeout was performed indicating the correct patient, procedure, positioning and need for preoperative antibiotics.   The palpable epigastric hernia was noted in the supraumbilical midline.  An incision was made over this and carried down through the subcutaneous tissue with electrocautery.  The hernia sac was identified and  circumferentially dissected down to the level of the fascia using Metzenbaum scissors.  The sac was evaluated and found to contain only fat.  This was opened at the apex.  This appeared to be primarily preperitoneal fat.  The sac was fully opened.  The fat was incarcerated and had measured out.  This was then excised with electrocautery and passed off as specimen.  The preperitoneal fat was inspected for hemostasis. The defect was measured and found to be 1.5 cm.  Given that this is a primary hernia and small, the decision was made to proceed with a primary repair.  The fascia been cleared circumferentially.  The defect was closed using interrupted 0 Ethibond sutures. Local anesthetic consisting of exparel + 0.25% marcaine with epinephrine was infiltrated. Wound was irrigated and hemostasis appreciated. The 'dead space' was then approximated using 3-0 Vicryl sutures.  The skin was closed with a running 4-0 Monocryl subcuticular suture. Sponge, needle and instrument counts were reported correct x2. Dermabond was applied.  He was then awakened from anesthesia, extubated, transferred to a stretcher for transport to PACU in satisfactory condition.

## 2020-01-25 NOTE — Anesthesia Postprocedure Evaluation (Signed)
Anesthesia Post Note  Patient: Kevin Paul  Procedure(s) Performed: open primary supraumbilical ventral hernia repair (N/A Abdomen)     Patient location during evaluation: PACU Anesthesia Type: General Level of consciousness: awake and alert Pain management: pain level controlled Vital Signs Assessment: post-procedure vital signs reviewed and stable Respiratory status: spontaneous breathing, nonlabored ventilation and respiratory function stable Cardiovascular status: blood pressure returned to baseline and stable Postop Assessment: no apparent nausea or vomiting Anesthetic complications: no   No complications documented.  Last Vitals:  Vitals:   01/25/20 1015 01/25/20 1030  BP: 119/89 116/88  Pulse: 64 (!) 59  Resp: 16 13  Temp: 36.6 C   SpO2: 98% 99%    Last Pain:  Vitals:   01/25/20 1030  TempSrc:   PainSc: 0-No pain                 Lidia Collum

## 2020-01-26 ENCOUNTER — Encounter (HOSPITAL_COMMUNITY): Payer: Self-pay | Admitting: Surgery

## 2020-01-26 LAB — SURGICAL PATHOLOGY

## 2020-01-30 ENCOUNTER — Telehealth: Payer: Self-pay

## 2020-01-30 NOTE — Telephone Encounter (Signed)
Pt called and asking if he could have a different medication that his insurance may approve for his BP. And if Generic would be 2nd alternative? Amlodipine isn't working he's waking up dizzy, and BP is sitting at 138/95 He's also willing to come in for an office visit if needs to.

## 2020-01-30 NOTE — Telephone Encounter (Signed)
I'd be glad to see him to figure out what to do next.

## 2020-01-30 NOTE — Telephone Encounter (Signed)
Pt has appt 02-02-20 @ 12:30

## 2020-02-01 ENCOUNTER — Other Ambulatory Visit: Payer: Self-pay | Admitting: *Deleted

## 2020-02-01 DIAGNOSIS — E785 Hyperlipidemia, unspecified: Secondary | ICD-10-CM

## 2020-02-01 MED ORDER — SIMVASTATIN 10 MG PO TABS: ORAL_TABLET | ORAL | 6 refills | Status: DC

## 2020-02-02 ENCOUNTER — Other Ambulatory Visit: Payer: Self-pay

## 2020-02-02 ENCOUNTER — Ambulatory Visit (INDEPENDENT_AMBULATORY_CARE_PROVIDER_SITE_OTHER): Payer: 59 | Admitting: Family Medicine

## 2020-02-02 VITALS — BP 140/80 | HR 82 | Temp 98.1°F | Ht 70.0 in | Wt 199.0 lb

## 2020-02-02 DIAGNOSIS — I10 Essential (primary) hypertension: Secondary | ICD-10-CM

## 2020-02-02 MED ORDER — HYDROCHLOROTHIAZIDE 12.5 MG PO CAPS: 12.5000 mg | ORAL_CAPSULE | Freq: Every day | ORAL | 5 refills | Status: DC

## 2020-02-02 NOTE — Progress Notes (Signed)
Subjective:    Patient ID: Kevin Paul, male    DOB: [birth year], 44 y.o.   MRN: [id number]  HPI 10/5 Patient is a very pleasant 44 year old Caucasian male here today complaining of erectile dysfunction.  He states that he is having a difficult time achieving and maintaining erections.  He has noticed this problem getting worse since he started blood pressure medication.  Earlier this year he was switched from amlodipine to lisinopril hydrochlorothiazide.  At that point the problem seemed to get much worse.  He also reports fatigue, decreasing libido, feeling tired.  He questions if his testosterone could be affected.  His total testosterone was checked earlier in the year and was over 500 however his free and bioavailable testosterone has not been evaluated nor has his thyroid.  He is tried Cialis on an as-needed basis however he does not like the lack of spontaneity that this causes.  He has not tried daily Cialis.  At that time, my plan was: I believe this is likely multifactorial.  I believe some of this could be due to blood pressure medication side effects.  Therefore I would like to stop lisinopril/HCTZ and try Bystolic 10 mg a day to see if the patient will have better results.  I will also check his free and bioavailable testosterone levels along with TSH to see if he would benefit from testosterone replacement.  If neither of these options is beneficial, the next step would be trying daily Cialis to see if this would allow spontaneity by having a low dose present in his system every day rather than being used on a as needed basis.  Gave the patient samples of Bystolic to try for 2 weeks first.  02/02/20 Patient did great while taking Bystolic.  Erection problems improved.  Fatigue improved.  Labs show normal testosterone levels and normal thyroid test.  However insurance will not cover diastolic or its generic.  We tried switching to amlodipine but this caused him to be dizzy. Past Medical  History:  Diagnosis Date  . Cardiomegaly 08/24/2014   [facility]  . COVID-19 virus infection 09/06/2019  . COVID-19 virus infection 09/06/2019  . Hematuria 08/24/2014   exercise induced- [facility]  . HSV-2 infection   . Hypertension   . Pneumonia    10 years ago  . PONV (postoperative nausea and vomiting)    Past Surgical History:  Procedure Laterality Date  . ANTERIOR CRUCIATE LIGAMENT REPAIR  07/1994  . KNEE ARTHROSCOPY Bilateral    Has had multiple knee surgeries in his Teens-20s  . VENTRAL HERNIA REPAIR N/A 01/25/2020   Procedure: open primary supraumbilical ventral hernia repair;  Surgeon: Ileana Roup, MD;  Location: [facility];  Service: General;  Laterality: N/A;  . WISDOM TOOTH EXTRACTION     Current Outpatient Medications on File Prior to Visit  Medication Sig Dispense Refill  . amLODipine (NORVASC) 10 MG tablet Take 1 tablet (10 mg total) by mouth daily. 90 tablet 3  . fluticasone (FLONASE) 50 MCG/ACT nasal spray Place 2 sprays into both nostrils daily as needed for rhinitis.    Marland Kitchen simvastatin (ZOCOR) 10 MG tablet TAKE 1 TABLET BY MOUTH EVERYDAY AT BEDTIME 30 tablet 6  . tadalafil (CIALIS) 10 MG tablet Take one tablet by mouth daily as needed for erectile dysfunction. (Patient taking differently: Take 10 mg by mouth daily as needed for erectile dysfunction. ) 30 tablet 3  . topiramate (TOPAMAX) 25 MG tablet Take 1 tablet (25 mg total)  by mouth daily. (Patient taking differently: Take 25 mg by mouth at bedtime. ) 30 tablet 3  . zolpidem (AMBIEN) 10 MG tablet TAKE 1 TABLET BY MOUTH EVERY DAY AT BEDTIME AS NEEDED FOR SLEEP (Patient taking differently: Take 10 mg by mouth at bedtime as needed for sleep. ) 25 tablet 0  . acyclovir (ZOVIRAX) 400 MG tablet TAKE 1 TABLET (400 MG TOTAL) BY MOUTH 3 (THREE) TIMES DAILY. FOR 5 DAYS (Patient not taking: Reported on 01/13/2020) 15 tablet 5  . nebivolol (BYSTOLIC) 10 MG tablet Take 1 tablet (10 mg total) by mouth daily. (Patient  not taking: Reported on 02/02/2020) 90 tablet 3   No current facility-administered medications on file prior to visit.   No Known Allergies Social History   Socioeconomic History  . Marital status: Married    Spouse name: Not on file  . Number of children: Not on file  . Years of education: Not on file  . Highest education level: Not on file  Occupational History  . Not on file  Tobacco Use  . Smoking status: Never Smoker  . Smokeless tobacco: Never Used  Vaping Use  . Vaping Use: Never used  Substance and Sexual Activity  . Alcohol use: Yes    Alcohol/week: 4.0 standard drinks    Types: 4 Cans of beer per week  . Drug use: No  . Sexual activity: Yes  Other Topics Concern  . Not on file  Social History Narrative   Paramedic for [employer]   Married. 3 children--2 are step children  1 is his own biologic child: Ages 95, 48, 79.   Last year started exercising: Runs 3 miles / day 3-4 days / week.   Also does push-ups, sit ups etc at home.    THIS  INFO  ENTERED  06/22/2013   Social Determinants of Health   Financial Resource Strain:   . Difficulty of Paying Living Expenses: Not on file  Food Insecurity:   . Worried About Charity fundraiser in the Last Year: Not on file  . Ran Out of Food in the Last Year: Not on file  Transportation Needs:   . Lack of Transportation (Medical): Not on file  . Lack of Transportation (Non-Medical): Not on file  Physical Activity:   . Days of Exercise per Week: Not on file  . Minutes of Exercise per Session: Not on file  Stress:   . Feeling of Stress : Not on file  Social Connections:   . Frequency of Communication with Friends and Family: Not on file  . Frequency of Social Gatherings with Friends and Family: Not on file  . Attends Religious Services: Not on file  . Active Member of Clubs or Organizations: Not on file  . Attends Archivist Meetings: Not on file  . Marital Status: Not on file  Intimate Partner Violence:    . Fear of Current or Ex-Partner: Not on file  . Emotionally Abused: Not on file  . Physically Abused: Not on file  . Sexually Abused: Not on file     Review of Systems  All other systems reviewed and are negative.      Objective:   Physical Exam Vitals reviewed.  Constitutional:      Appearance: Normal appearance.  Cardiovascular:     Rate and Rhythm: Normal rate and regular rhythm.     Heart sounds: Normal heart sounds.  Pulmonary:     Effort: Pulmonary effort is normal.  Breath sounds: Normal breath sounds.  Neurological:     Mental Status: He is alert.           Assessment & Plan:  Essential hypertension  Recommend trying just plain hydrochlorothiazide 12.5 mg daily by itself and see if he does better on this medication.  Reassess in 1 month.  If no better I would recommend discontinuation of hydrochlorothiazide and trying valsartan.  Avoid amlodipine as this causes dizziness

## 2020-02-05 ENCOUNTER — Other Ambulatory Visit: Payer: Self-pay | Admitting: Family Medicine

## 2020-02-06 NOTE — Telephone Encounter (Signed)
Please advise 

## 2020-03-05 ENCOUNTER — Other Ambulatory Visit: Payer: Self-pay | Admitting: Family Medicine

## 2020-03-06 NOTE — Telephone Encounter (Signed)
Ok to refill??  Last office visit 02/02/2020.  Last refill 02/06/2020.

## 2020-03-28 ENCOUNTER — Encounter: Payer: Self-pay | Admitting: Family Medicine

## 2020-03-29 MED ORDER — HYDROCHLOROTHIAZIDE 25 MG PO TABS: 25.0000 mg | ORAL_TABLET | Freq: Every day | ORAL | 3 refills | Status: DC

## 2020-07-19 ENCOUNTER — Other Ambulatory Visit: Payer: 59

## 2020-07-19 ENCOUNTER — Other Ambulatory Visit: Payer: Self-pay

## 2020-07-19 DIAGNOSIS — Z13220 Encounter for screening for lipoid disorders: Secondary | ICD-10-CM

## 2020-07-19 DIAGNOSIS — R7309 Other abnormal glucose: Secondary | ICD-10-CM

## 2020-07-19 DIAGNOSIS — I10 Essential (primary) hypertension: Secondary | ICD-10-CM

## 2020-07-19 DIAGNOSIS — E785 Hyperlipidemia, unspecified: Secondary | ICD-10-CM

## 2020-07-20 LAB — COMPLETE METABOLIC PANEL WITH GFR
AG Ratio: 1.8 (calc) (ref 1.0–2.5)
ALT: 24 U/L (ref 9–46)
AST: 19 U/L (ref 10–40)
Albumin: 4.8 g/dL (ref 3.6–5.1)
Alkaline phosphatase (APISO): 34 U/L — ABNORMAL LOW (ref 36–130)
BUN: 18 mg/dL (ref 7–25)
CO2: 29 mmol/L (ref 20–32)
Calcium: 9.8 mg/dL (ref 8.6–10.3)
Chloride: 101 mmol/L (ref 98–110)
Creat: 1.19 mg/dL (ref 0.60–1.35)
GFR, Est African American: 85 mL/min/{1.73_m2} (ref >=60)
GFR, Est Non African American: 73 mL/min/{1.73_m2} (ref >=60)
Globulin: 2.7 g/dL (calc) (ref 1.9–3.7)
Glucose, Bld: 93 mg/dL (ref 65–99)
Potassium: 4.3 mmol/L (ref 3.5–5.3)
Sodium: 139 mmol/L (ref 135–146)
Total Bilirubin: 0.8 mg/dL (ref 0.2–1.2)
Total Protein: 7.5 g/dL (ref 6.1–8.1)

## 2020-07-20 LAB — CBC WITH DIFFERENTIAL/PLATELET
Absolute Monocytes: 774 cells/uL (ref 200–950)
Basophils Absolute: 32 cells/uL (ref 0–200)
Basophils Relative: 0.5 %
Eosinophils Absolute: 480 cells/uL (ref 15–500)
Eosinophils Relative: 7.5 %
HCT: 48.5 % (ref 38.5–50.0)
Hemoglobin: 16.8 g/dL (ref 13.2–17.1)
Lymphs Abs: 2637 cells/uL (ref 850–3900)
MCH: 31.6 pg (ref 27.0–33.0)
MCHC: 34.6 g/dL (ref 32.0–36.0)
MCV: 91.3 fL (ref 80.0–100.0)
MPV: 10.4 fL (ref 7.5–12.5)
Monocytes Relative: 12.1 %
Neutro Abs: 2477 cells/uL (ref 1500–7800)
Neutrophils Relative %: 38.7 %
Platelets: 247 10*3/uL (ref 140–400)
RBC: 5.31 10*6/uL (ref 4.20–5.80)
RDW: 12.5 % (ref 11.0–15.0)
Total Lymphocyte: 41.2 %
WBC: 6.4 10*3/uL (ref 3.8–10.8)

## 2020-07-20 LAB — MICROALBUMIN / CREATININE URINE RATIO
Creatinine, Urine: 243 mg/dL (ref 20–320)
Microalb Creat Ratio: 3 mcg/mg creat (ref <30)
Microalb, Ur: 0.7 mg/dL

## 2020-07-20 LAB — LIPID PANEL
Cholesterol: 195 mg/dL (ref <200)
HDL: 48 mg/dL (ref >=40)
LDL Cholesterol (Calc): 123 mg/dL (calc) — ABNORMAL HIGH
Non-HDL Cholesterol (Calc): 147 mg/dL (calc) — ABNORMAL HIGH (ref <130)
Total CHOL/HDL Ratio: 4.1 (calc) (ref <5.0)
Triglycerides: 127 mg/dL (ref <150)

## 2020-07-20 LAB — HEMOGLOBIN A1C
Hgb A1c MFr Bld: 5.8 % of total Hgb — ABNORMAL HIGH (ref <5.7)
Mean Plasma Glucose: 120 mg/dL
eAG (mmol/L): 6.6 mmol/L

## 2020-07-26 ENCOUNTER — Other Ambulatory Visit: Payer: Self-pay

## 2020-07-26 ENCOUNTER — Encounter: Payer: Self-pay | Admitting: Family Medicine

## 2020-07-26 ENCOUNTER — Ambulatory Visit: Payer: 59 | Admitting: Family Medicine

## 2020-07-26 VITALS — BP 136/82 | HR 90 | Temp 98.3°F | Resp 14 | Ht 70.0 in | Wt 199.0 lb

## 2020-07-26 DIAGNOSIS — Z0000 Encounter for general adult medical examination without abnormal findings: Secondary | ICD-10-CM

## 2020-07-26 DIAGNOSIS — Z0001 Encounter for general adult medical examination with abnormal findings: Secondary | ICD-10-CM | POA: Diagnosis not present

## 2020-07-26 DIAGNOSIS — I10 Essential (primary) hypertension: Secondary | ICD-10-CM

## 2020-07-26 DIAGNOSIS — E785 Hyperlipidemia, unspecified: Secondary | ICD-10-CM

## 2020-07-26 MED ORDER — ROSUVASTATIN CALCIUM 20 MG PO TABS: 20.0000 mg | ORAL_TABLET | Freq: Every day | ORAL | 3 refills | Status: DC

## 2020-07-26 NOTE — Progress Notes (Signed)
Subjective:    Patient ID: Kevin Paul, male    DOB: 1975-04-05, 45 y.o.   MRN: [id number]  Today for complete physical exam.  States his blood pressure is elevated at home.  Blood pressure is in the 140s to 150s at home.  Here is in the 130s.  He denies any chest pain shortness of breath or dyspnea on exertion.  He is questioning if his blood pressure cuff is accurate or if the hydrochlorothiazide is effective.  He is due for colon cancer screening this year.  He is not yet due for prostate cancer screening.  He states the erectile dysfunction has improved.  Most recent lab work is listed below. Lab on 07/19/2020  Component Date Value Ref Range Status  . WBC 07/19/2020 6.4  3.8 - 10.8 Thousand/uL Final  . RBC 07/19/2020 5.31  4.20 - 5.80 Million/uL Final  . Hemoglobin 07/19/2020 16.8  13.2 - 17.1 g/dL Final  . HCT 07/19/2020 48.5  38.5 - 50.0 % Final  . MCV 07/19/2020 91.3  80.0 - 100.0 fL Final  . MCH 07/19/2020 31.6  27.0 - 33.0 pg Final  . MCHC 07/19/2020 34.6  32.0 - 36.0 g/dL Final  . RDW 07/19/2020 12.5  11.0 - 15.0 % Final  . Platelets 07/19/2020 247  140 - 400 Thousand/uL Final  . MPV 07/19/2020 10.4  7.5 - 12.5 fL Final  . Neutro Abs 07/19/2020 2,477  1,500 - 7,800 cells/uL Final  . Lymphs Abs 07/19/2020 2,637  850 - 3,900 cells/uL Final  . Absolute Monocytes 07/19/2020 774  200 - 950 cells/uL Final  . Eosinophils Absolute 07/19/2020 480  15 - 500 cells/uL Final  . Basophils Absolute 07/19/2020 32  0 - 200 cells/uL Final  . Neutrophils Relative % 07/19/2020 38.7  % Final  . Total Lymphocyte 07/19/2020 41.2  % Final  . Monocytes Relative 07/19/2020 12.1  % Final  . Eosinophils Relative 07/19/2020 7.5  % Final  . Basophils Relative 07/19/2020 0.5  % Final  . Glucose, Bld 07/19/2020 93  65 - 99 mg/dL Final   Comment: .            Fasting reference interval .   . BUN 07/19/2020 18  7 - 25 mg/dL Final  . Creat 07/19/2020 1.19  0.60 - 1.35 mg/dL Final  . GFR, Est Non  African American 07/19/2020 73  > OR = 60 mL/min/1.27m Final  . GFR, Est African American 07/19/2020 85  > OR = 60 mL/min/1.713mFinal  . BUN/Creatinine Ratio 0414/97/0263OT APPLICABLE  6 - 22 (calc) Final  . Sodium 07/19/2020 139  135 - 146 mmol/L Final  . Potassium 07/19/2020 4.3  3.5 - 5.3 mmol/L Final  . Chloride 07/19/2020 101  98 - 110 mmol/L Final  . CO2 07/19/2020 29  20 - 32 mmol/L Final  . Calcium 07/19/2020 9.8  8.6 - 10.3 mg/dL Final  . Total Protein 07/19/2020 7.5  6.1 - 8.1 g/dL Final  . Albumin 07/19/2020 4.8  3.6 - 5.1 g/dL Final  . Globulin 07/19/2020 2.7  1.9 - 3.7 g/dL (calc) Final  . AG Ratio 07/19/2020 1.8  1.0 - 2.5 (calc) Final  . Total Bilirubin 07/19/2020 0.8  0.2 - 1.2 mg/dL Final  . Alkaline phosphatase (APISO) 07/19/2020 34* 36 - 130 U/L Final  . AST 07/19/2020 19  10 - 40 U/L Final  . ALT 07/19/2020 24  9 - 46 U/L Final  . Cholesterol 07/19/2020 195  <200  mg/dL Final  . HDL 07/19/2020 48  > OR = 40 mg/dL Final  . Triglycerides 07/19/2020 127  <150 mg/dL Final  . LDL Cholesterol (Calc) 07/19/2020 123* mg/dL (calc) Final   Comment: Reference range: <100 . Desirable range <100 mg/dL for primary prevention;   <70 mg/dL for patients with CHD or diabetic patients  with > or = 2 CHD risk factors. Marland Kitchen LDL-C is now calculated using the Martin-Hopkins  calculation, which is a validated novel method providing  better accuracy than the Friedewald equation in the  estimation of LDL-C.  Cresenciano Genre et al. Annamaria Helling. 1610;960(45): 2061-2068  (http://education.[facility].com/faq/FAQ164)   . Total CHOL/HDL Ratio 07/19/2020 4.1  <5.0 (calc) Final  . Non-HDL Cholesterol (Calc) 07/19/2020 147* <130 mg/dL (calc) Final   Comment: For patients with diabetes plus 1 major ASCVD risk  factor, treating to a non-HDL-C goal of <100 mg/dL  (LDL-C of <70 mg/dL) is considered a therapeutic  option.   . Hgb A1c MFr Bld 07/19/2020 5.8* <5.7 % of total Hgb Final   Comment: For  someone without known diabetes, a hemoglobin  A1c value between 5.7% and 6.4% is consistent with prediabetes and should be confirmed with a  follow-up test. . For someone with known diabetes, a value <7% indicates that their diabetes is well controlled. A1c targets should be individualized based on duration of diabetes, age, comorbid conditions, and other considerations. . This assay result is consistent with an increased risk of diabetes. . Currently, no consensus exists regarding use of hemoglobin A1c for diagnosis of diabetes for children. .   . Mean Plasma Glucose 07/19/2020 120  mg/dL Final  . eAG (mmol/L) 07/19/2020 6.6  mmol/L Final  . Creatinine, Urine 07/19/2020 243  20 - 320 mg/dL Final  . Microalb, Ur 07/19/2020 0.7  mg/dL Final   Comment: Reference Range Not established   . Microalb Creat Ratio 07/19/2020 3  <30 mcg/mg creat Final   Comment: . The ADA defines abnormalities in albumin excretion as follows: Marland Kitchen Albuminuria Category        Result (mcg/mg creatinine) . Normal to Mildly increased   <30 Moderately increased         30-299  Severely increased           > OR = 300 . The ADA recommends that at least two of three specimens collected within a 3-6 month period be abnormal before considering a patient to be within a diagnostic category.      Past Medical History:  Diagnosis Date  . Cardiomegaly 08/24/2014   [facility]  . COVID-19 virus infection 09/06/2019  . COVID-19 virus infection 09/06/2019  . Hematuria 08/24/2014   exercise induced- [facility]  . HSV-2 infection   . Hypertension   . Pneumonia    10 years ago  . PONV (postoperative nausea and vomiting)    Past Surgical History:  Procedure Laterality Date  . ANTERIOR CRUCIATE LIGAMENT REPAIR  07/1994  . KNEE ARTHROSCOPY Bilateral    Has had multiple knee surgeries in his Teens-20s  . VENTRAL HERNIA REPAIR N/A 01/25/2020   Procedure: open primary supraumbilical ventral hernia repair;   Surgeon: Ileana Roup, MD;  Location: [facility];  Service: General;  Laterality: N/A;  . WISDOM TOOTH EXTRACTION     Current Outpatient Medications on File Prior to Visit  Medication Sig Dispense Refill  . acyclovir (ZOVIRAX) 400 MG tablet TAKE 1 TABLET (400 MG TOTAL) BY MOUTH 3 (THREE) TIMES DAILY. FOR 5 DAYS (Patient  not taking: Reported on 01/13/2020) 15 tablet 5  . fluticasone (FLONASE) 50 MCG/ACT nasal spray Place 2 sprays into both nostrils daily as needed for rhinitis.    . hydrochlorothiazide (HYDRODIURIL) 25 MG tablet Take 1 tablet (25 mg total) by mouth daily. 90 tablet 3  . simvastatin (ZOCOR) 10 MG tablet TAKE 1 TABLET BY MOUTH EVERYDAY AT BEDTIME 30 tablet 6  . tadalafil (CIALIS) 10 MG tablet Take one tablet by mouth daily as needed for erectile dysfunction. (Patient taking differently: Take 10 mg by mouth daily as needed for erectile dysfunction. ) 30 tablet 3  . topiramate (TOPAMAX) 25 MG tablet Take 1 tablet (25 mg total) by mouth daily. (Patient taking differently: Take 25 mg by mouth at bedtime. ) 30 tablet 3  . zolpidem (AMBIEN) 10 MG tablet TAKE 1 TABLET BY MOUTH EVERY DAY AT BEDTIME AS NEEDED FOR SLEEP 30 tablet 5   No current facility-administered medications on file prior to visit.   No Known Allergies Social History   Socioeconomic History  . Marital status: Married    Spouse name: Not on file  . Number of children: Not on file  . Years of education: Not on file  . Highest education level: Not on file  Occupational History  . Not on file  Tobacco Use  . Smoking status: Never Smoker  . Smokeless tobacco: Never Used  Vaping Use  . Vaping Use: Never used  Substance and Sexual Activity  . Alcohol use: Yes    Alcohol/week: 4.0 standard drinks    Types: 4 Cans of beer per week  . Drug use: No  . Sexual activity: Yes  Other Topics Concern  . 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 3 children--2 are step children   1 is his own biologic child: Ages 21, 25, 55.   Last year started exercising: Runs 3 miles / day 3-4 days / week.   Also does push-ups, sit ups etc at home.    THIS  INFO  ENTERED  06/22/2013   Social Determinants of Health   Financial Resource Strain: Not on file  Food Insecurity: Not on file  Transportation Needs: Not on file  Physical Activity: Not on file  Stress: Not on file  Social Connections: Not on file  Intimate Partner Violence: Not on file     Review of Systems  All other systems reviewed and are negative.      Objective:   Physical Exam Vitals reviewed.  Constitutional:      General: He is not in acute distress.    Appearance: Normal appearance. He is normal weight. He is not ill-appearing or toxic-appearing.  HENT:     Head: Normocephalic and atraumatic.     Right Ear: Tympanic membrane and ear canal normal.     Left Ear: Tympanic membrane and ear canal normal.     Nose: Nose normal. No congestion or rhinorrhea.     Mouth/Throat:     Mouth: Mucous membranes are moist.     Pharynx: No oropharyngeal exudate or posterior oropharyngeal erythema.  Eyes:     General:        Right eye: No discharge.        Left eye: No discharge.     Extraocular Movements: Extraocular movements intact.     Conjunctiva/sclera: Conjunctivae normal.     Pupils: Pupils are equal, round, and reactive to light.  Neck:     Vascular:  No carotid bruit.  Cardiovascular:     Rate and Rhythm: Normal rate and regular rhythm.     Heart sounds: Normal heart sounds. No murmur heard. No gallop.   Pulmonary:     Effort: Pulmonary effort is normal. No respiratory distress.     Breath sounds: Normal breath sounds. No stridor. No wheezing, rhonchi or rales.  Chest:     Chest wall: No tenderness.  Abdominal:     General: Bowel sounds are normal. There is no distension.     Palpations: Abdomen is soft.     Tenderness: There is no abdominal tenderness. There is no guarding or rebound.   Musculoskeletal:     Cervical back: Normal range of motion and neck supple. No tenderness.     Right lower leg: No edema.     Left lower leg: No edema.  Lymphadenopathy:     Cervical: No cervical adenopathy.  Skin:    General: Skin is warm.     Coloration: Skin is not jaundiced or pale.     Findings: No bruising, erythema, lesion or rash.  Neurological:     General: No focal deficit present.     Mental Status: He is alert and oriented to person, place, and time. Mental status is at baseline.     Cranial Nerves: No cranial nerve deficit.     Motor: No weakness.     Gait: Gait normal.  Psychiatric:        Mood and Affect: Mood normal.        Behavior: Behavior normal.        Thought Content: Thought content normal.        Judgment: Judgment normal.           Assessment & Plan:  General medical exam  Essential hypertension  Hyperlipidemia, unspecified hyperlipidemia type  Physical exam today is good.  I have asked the patient to bring his blood pressure cuff by and let us check its accuracy.  If his blood pressure cuff is accurate and if he is consistently in the 140s to 150s I would add valsartan.  I calculated his 10-year risk of cardiovascular disease to be 2.7% however his father and his grandfather both suffered cardiovascular events in their 23s.  His father had a heart attack and his grandfather had a stroke.  Therefore, together, we discussed it to discontinue simvastatin and switch to a high intensity statin Crestor 20 mg a day.  Recheck lab work in 3 to 6 months.

## 2020-08-09 ENCOUNTER — Encounter: Payer: Self-pay | Admitting: Family Medicine

## 2020-08-31 ENCOUNTER — Other Ambulatory Visit: Payer: Self-pay | Admitting: Family Medicine

## 2020-10-03 ENCOUNTER — Other Ambulatory Visit: Payer: Self-pay | Admitting: Family Medicine

## 2020-10-03 NOTE — Telephone Encounter (Signed)
Ok to refill??  Last office visit 07/26/2020.  Last refill 09/03/2020.  Ok to add refills to prescription?

## 2020-11-25 ENCOUNTER — Other Ambulatory Visit: Payer: Self-pay | Admitting: Family Medicine

## 2020-11-25 DIAGNOSIS — N529 Male erectile dysfunction, unspecified: Secondary | ICD-10-CM

## 2020-11-27 NOTE — Telephone Encounter (Signed)
Ok to refill 

## 2021-02-01 ENCOUNTER — Telehealth: Payer: Self-pay | Admitting: *Deleted

## 2021-02-01 DIAGNOSIS — Z1211 Encounter for screening for malignant neoplasm of colon: Secondary | ICD-10-CM

## 2021-02-01 DIAGNOSIS — Z1212 Encounter for screening for malignant neoplasm of rectum: Secondary | ICD-10-CM

## 2021-02-01 NOTE — Telephone Encounter (Signed)
Patient due for Cologuard re-screen.   Order placed via [facility].

## 2021-02-03 ENCOUNTER — Other Ambulatory Visit: Payer: Self-pay | Admitting: Family Medicine

## 2021-02-04 NOTE — Telephone Encounter (Signed)
Ok to refill??  Last office visit 07/26/2020.  Last refill 10/04/2020, #3 refills.

## 2021-02-28 LAB — COLOGUARD: COLOGUARD: NEGATIVE

## 2021-03-26 ENCOUNTER — Other Ambulatory Visit: Payer: Self-pay

## 2021-03-26 MED ORDER — HYDROCHLOROTHIAZIDE 25 MG PO TABS: 25.0000 mg | ORAL_TABLET | Freq: Every day | ORAL | 3 refills | Status: DC

## 2021-04-29 ENCOUNTER — Ambulatory Visit: Payer: 59 | Admitting: Family Medicine

## 2021-04-29 ENCOUNTER — Other Ambulatory Visit: Payer: Self-pay

## 2021-04-29 ENCOUNTER — Other Ambulatory Visit: Payer: Self-pay | Admitting: Family Medicine

## 2021-04-29 ENCOUNTER — Encounter: Payer: Self-pay | Admitting: Family Medicine

## 2021-04-29 VITALS — BP 142/102 | HR 86 | Temp 97.7°F | Resp 18 | Ht 70.0 in | Wt 203.0 lb

## 2021-04-29 DIAGNOSIS — I517 Cardiomegaly: Secondary | ICD-10-CM

## 2021-04-29 DIAGNOSIS — I10 Essential (primary) hypertension: Secondary | ICD-10-CM

## 2021-04-29 MED ORDER — NEBIVOLOL HCL 5 MG PO TABS: 5.0000 mg | ORAL_TABLET | Freq: Every day | ORAL | 3 refills | Status: DC

## 2021-04-29 NOTE — Progress Notes (Signed)
Subjective:    Patient ID: Kevin Paul, male    DOB: Oct 26, 1975, 46 y.o.   MRN: [id number] Patient has a history of hypertension.  Despite taking hydrochlorothiazide, his blood pressure has recently been elevated.  Today is 142/102.  He states that he is been getting similar readings at home.  He denies any chest pain shortness of breath or dyspnea on exertion.  He has been told in the past on the CT scan that he had cardiomegaly.  He never followed up with cardiology.  He also reports a dull constant headache in his forehead and in his cheeks.  He describes it as a viselike pressure around his head that occurs on a daily basis.  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  He denies any neurologic deficits, photophobia, phonophobia, nausea, dizziness.    Past Medical History:  Diagnosis Date   Cardiomegaly 08/24/2014   [facility]   COVID-19 virus infection 09/06/2019   COVID-19 virus infection 09/06/2019   Hematuria 08/24/2014   exercise induced- [facility]   HSV-2 infection    Hypertension    Pneumonia    10 years ago   PONV (postoperative nausea and vomiting)    Past Surgical History:  Procedure Laterality Date   ANTERIOR CRUCIATE LIGAMENT REPAIR  07/1994   KNEE ARTHROSCOPY Bilateral    Has had multiple knee surgeries in his Teens-20s   VENTRAL HERNIA REPAIR N/A 01/25/2020   Procedure: open primary supraumbilical ventral hernia repair;  Surgeon: Ileana Roup, MD;  Location: [facility];  Service: General;  Laterality: N/A;   WISDOM TOOTH EXTRACTION     Current Outpatient Medications on File Prior to Visit  Medication Sig Dispense Refill   fluticasone (FLONASE) 50 MCG/ACT nasal spray Place 2 sprays into both nostrils daily as needed for rhinitis.     hydrochlorothiazide (HYDRODIURIL) 25 MG tablet Take 1 tablet (25 mg total) by mouth daily. 90 tablet 3   rosuvastatin (CRESTOR) 20 MG tablet Take 1 tablet (20 mg total)  by mouth daily. 90 tablet 3   zolpidem (AMBIEN) 10 MG tablet TAKE 1 TABLET BY MOUTH AT BEDTIME AS NEEDED FOR SLEEP 30 tablet 3   acyclovir (ZOVIRAX) 400 MG tablet TAKE 1 TABLET (400 MG TOTAL) BY MOUTH 3 (THREE) TIMES DAILY. FOR 5 DAYS (Patient not taking: Reported on 01/13/2020) 15 tablet 5   tadalafil (CIALIS) 10 MG tablet TAKE ONE TABLET BY MOUTH DAILY AS NEEDED FOR ERECTILE DYSFUNCTION. 5 tablet 23   No current facility-administered medications on file prior to visit.   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   Last year started exercising: Runs 3 miles / day 3-4 days / week.   Also does push-ups, sit ups etc at home.    THIS  INFO  ENTERED  06/22/2013   Social Determinants of Health   Financial Resource Strain: Not on file  Food Insecurity: Not on file  Transportation Needs: Not on file  Physical Activity: Not on file  Stress: Not on file  Social Connections: Not on file  Intimate Partner Violence: Not on file     Review of Systems  All other systems reviewed and are negative.      Objective:   Physical Exam Vitals reviewed.  Constitutional:      General: He is not in acute distress.    Appearance: Normal appearance. He is normal weight. He is not ill-appearing or toxic-appearing.  HENT:     Head:  Normocephalic and atraumatic.     Right Ear: Tympanic membrane and ear canal normal.     Left Ear: Tympanic membrane and ear canal normal.     Nose: Nose normal. No congestion or rhinorrhea.     Mouth/Throat:     Mouth: Mucous membranes are moist.     Pharynx: No oropharyngeal exudate or posterior oropharyngeal erythema.  Eyes:     General:        Right eye: No discharge.        Left eye: No discharge.     Extraocular Movements: Extraocular movements intact.     Conjunctiva/sclera: Conjunctivae normal.     Pupils: Pupils are equal, round, and reactive to light.  Neck:     Vascular: No carotid bruit.  Cardiovascular:     Rate and Rhythm: Normal rate and regular rhythm.     Heart sounds: Normal heart sounds. No murmur heard.   No gallop.  Pulmonary:     Effort: Pulmonary effort is normal. No respiratory distress.     Breath sounds: Normal breath sounds. No stridor. No wheezing, rhonchi or rales.  Chest:     Chest wall: No tenderness.  Abdominal:     General: Bowel sounds are normal. There is no distension.     Palpations: Abdomen is soft.     Tenderness: There is no abdominal tenderness. There is no guarding or rebound.  Musculoskeletal:     Cervical back: Normal range of motion and neck supple. No tenderness.     Right lower leg: No edema.     Left lower leg: No edema.  Lymphadenopathy:     Cervical: No cervical adenopathy.  Skin:    General: Skin is warm.     Coloration: Skin is not jaundiced or pale.     Findings: No bruising, erythema, lesion or rash.  Neurological:     General: No focal deficit present.     Mental Status: He is alert and oriented to person, place, and time. Mental status is at baseline.     Cranial Nerves: No cranial nerve deficit.     Motor: No weakness.     Gait: Gait normal.  Psychiatric:        Mood and Affect: Mood normal.        Behavior: Behavior normal.        Thought Content: Thought content normal.        Judgment: Judgment normal.           Assessment & Plan:  Cardiomegaly - Plan: ECHOCARDIOGRAM COMPLETE  Essential hypertension - Plan: CBC with  Differential/Platelet, COMPLETE METABOLIC PANEL WITH GFR, Lipid panel Add Bystolic 5 mg at night hydrochlorothiazide recheck blood pressure 2 weeks.  If headaches persist, consider treatment for tension headaches muscle relaxer..  Obtain echocardiogram for evaluation of cardiomegaly.  Return fasting for CBC, CMP, lipid panel

## 2021-05-08 ENCOUNTER — Ambulatory Visit (HOSPITAL_COMMUNITY): Payer: 59

## 2021-05-14 ENCOUNTER — Encounter: Payer: Self-pay | Admitting: Family Medicine

## 2021-05-16 MED ORDER — AMLODIPINE BESYLATE 10 MG PO TABS: 10.0000 mg | ORAL_TABLET | Freq: Every day | ORAL | 3 refills | Status: DC

## 2021-06-10 ENCOUNTER — Ambulatory Visit: Payer: 59 | Admitting: Family Medicine

## 2021-06-10 ENCOUNTER — Other Ambulatory Visit: Payer: Self-pay

## 2021-06-10 VITALS — BP 132/101 | HR 71 | Temp 97.3°F | Ht 70.0 in | Wt 202.8 lb

## 2021-06-10 DIAGNOSIS — E785 Hyperlipidemia, unspecified: Secondary | ICD-10-CM

## 2021-06-10 DIAGNOSIS — I10 Essential (primary) hypertension: Secondary | ICD-10-CM

## 2021-06-10 DIAGNOSIS — L300 Nummular dermatitis: Secondary | ICD-10-CM | POA: Diagnosis not present

## 2021-06-10 MED ORDER — MOMETASONE FUROATE 0.1 % EX CREA: TOPICAL_CREAM | Freq: Every day | CUTANEOUS | 1 refills | Status: AC

## 2021-06-10 NOTE — Progress Notes (Signed)
? ?Subjective:  ? ? Patient ID: Kevin Paul, male    DOB: 07-12-1975, 46 y.o.   MRN: [id number] ?Patient is a very pleasant 46 year old gentleman who presents with a 4-week history of rash.  The rash is confined to his posterior hips, gluteus region bilaterally, and a few scattered patches on both lower legs.  On his gluteus region, there is a serpiginous border erythematous plaque roughly 3 inches in diameter.  This is located on both lower gluteal regions and upper posterior thighs.  He then has smaller coin sized patches and plaques on his lower extremities.  Most are 3 to 4 cm in size.  There are 1 cm patches on his medial ankles bilaterally.  He has tried over-the-counter cortisone cream for 3 to 4 days as well as over-the-counter antifungal cream for 3 to 4 days with no improvement.  No close contacts including his wife have a similar rash. ?Past Medical History:  ?Diagnosis Date  ? Cardiomegaly 08/24/2014  ? [facility]  ? COVID-19 virus infection 09/06/2019  ? COVID-19 virus infection 09/06/2019  ? Hematuria 08/24/2014  ? exercise induced- [facility]  ? HSV-2 infection   ? Hypertension   ? Pneumonia   ? 10 years ago  ? PONV (postoperative nausea and vomiting)   ? ?Past Surgical History:  ?Procedure Laterality Date  ? ANTERIOR CRUCIATE LIGAMENT REPAIR  07/1994  ? KNEE ARTHROSCOPY Bilateral   ? Has had multiple knee surgeries in his Teens-20s  ? VENTRAL HERNIA REPAIR N/A 01/25/2020  ? Procedure: open primary supraumbilical ventral hernia repair;  Surgeon: Ileana Roup, MD;  Location: [facility];  Service: General;  Laterality: N/A;  ? WISDOM TOOTH EXTRACTION    ? ?Current Outpatient Medications on File Prior to Visit  ?Medication Sig Dispense Refill  ? acyclovir (ZOVIRAX) 400 MG tablet TAKE 1 TABLET (400 MG TOTAL) BY MOUTH 3 (THREE) TIMES DAILY. FOR 5 DAYS 15 tablet 5  ? amLODipine (NORVASC) 10 MG tablet Take 1 tablet (10 mg total) by mouth daily. 90 tablet 3  ? fluticasone (FLONASE) 50 MCG/ACT  nasal spray Place 2 sprays into both nostrils daily as needed for rhinitis.    ? hydrochlorothiazide (HYDRODIURIL) 25 MG tablet Take 1 tablet (25 mg total) by mouth daily. 90 tablet 3  ? rosuvastatin (CRESTOR) 20 MG tablet Take 1 tablet (20 mg total) by mouth daily. 90 tablet 3  ? tadalafil (CIALIS) 10 MG tablet TAKE ONE TABLET BY MOUTH DAILY AS NEEDED FOR ERECTILE DYSFUNCTION. 5 tablet 23  ? zolpidem (AMBIEN) 10 MG tablet TAKE 1 TABLET BY MOUTH AT BEDTIME AS NEEDED FOR SLEEP 30 tablet 3  ? ?No current facility-administered medications on file prior to visit.  ? ?No Known Allergies ?Social History  ? ?Socioeconomic History  ? Marital status: Married  ?  Spouse name: Not on file  ? Number of children: Not on file  ? Years of education: Not on file  ? Highest education level: Not on file  ?Occupational History  ? Not on file  ?Tobacco Use  ? Smoking status: Never  ? Smokeless tobacco: Never  ?Vaping Use  ? Vaping Use: Never used  ?Substance and Sexual Activity  ? Alcohol use: Yes  ?  Alcohol/week: 4.0 standard drinks  ?  Types: 4 Cans of beer per week  ? Drug use: No  ? Sexual activity: Yes  ?Other Topics Concern  ? Not on file  ?Social History Narrative  ? Paramedic for [employer]  ?  Married. 3 children--2 are step children  1 is his own biologic child: Ages 60, 12, 89.  ? Last year started exercising: Runs 3 miles / day 3-4 days / week.  ? Also does push-ups, sit ups etc at home.   ? THIS  INFO  ENTERED  06/22/2013  ? ?Social Determinants of Health  ? ?Financial Resource Strain: Not on file  ?Food Insecurity: Not on file  ?Transportation Needs: Not on file  ?Physical Activity: Not on file  ?Stress: Not on file  ?Social Connections: Not on file  ?Intimate Partner Violence: Not on file  ? ? ? ?Review of Systems  ?All other systems reviewed and are negative. ? ?   ?Objective:  ? Physical Exam ?Vitals reviewed.  ?Constitutional:   ?   General: He is not in acute distress. ?   Appearance: Normal appearance. He is  normal weight. He is not ill-appearing or toxic-appearing.  ?HENT:  ?   Head: Normocephalic and atraumatic.  ?   Right Ear: Tympanic membrane and ear canal normal.  ?   Left Ear: Tympanic membrane and ear canal normal.  ?   Nose: Nose normal. No congestion or rhinorrhea.  ?   Mouth/Throat:  ?   Mouth: Mucous membranes are moist.  ?   Pharynx: No oropharyngeal exudate or posterior oropharyngeal erythema.  ?Eyes:  ?   General:     ?   Right eye: No discharge.     ?   Left eye: No discharge.  ?   Extraocular Movements: Extraocular movements intact.  ?   Conjunctiva/sclera: Conjunctivae normal.  ?   Pupils: Pupils are equal, round, and reactive to light.  ?Neck:  ?   Vascular: No carotid bruit.  ?Cardiovascular:  ?   Rate and Rhythm: Normal rate and regular rhythm.  ?   Heart sounds: Normal heart sounds. No murmur heard. ?  No gallop.  ?Pulmonary:  ?   Effort: Pulmonary effort is normal. No respiratory distress.  ?   Breath sounds: Normal breath sounds. No stridor. No wheezing, rhonchi or rales.  ?Chest:  ?   Chest wall: No tenderness.  ?Abdominal:  ?   General: Bowel sounds are normal. There is no distension.  ?   Palpations: Abdomen is soft.  ?   Tenderness: There is no abdominal tenderness. There is no guarding or rebound.  ?Musculoskeletal:  ?   Cervical back: Normal range of motion and neck supple. No tenderness.  ?   Right lower leg: No edema.  ?   Left lower leg: No edema.  ?Lymphadenopathy:  ?   Cervical: No cervical adenopathy.  ?Skin: ?   General: Skin is warm.  ?   Coloration: Skin is not jaundiced or pale.  ?   Findings: No bruising, erythema, lesion or rash.  ?Neurological:  ?   General: No focal deficit present.  ?   Mental Status: He is alert and oriented to person, place, and time. Mental status is at baseline.  ?   Cranial Nerves: No cranial nerve deficit.  ?   Motor: No weakness.  ?   Gait: Gait normal.  ?Psychiatric:     ?   Mood and Affect: Mood normal.     ?   Behavior: Behavior normal.     ?    Thought Content: Thought content normal.     ?   Judgment: Judgment normal.  ? ? ? ? ? ?   ?Assessment & Plan:  ?Essential  hypertension - Plan: CBC with Differential/Platelet, Lipid panel, COMPLETE METABOLIC PANEL WITH GFR ? ?Hyperlipidemia, unspecified hyperlipidemia type - Plan: CBC with Differential/Platelet, Lipid panel, COMPLETE METABOLIC PANEL WITH GFR ? ?Nummular eczema ?I am not entirely certain.  I believe the patient has nummular eczema.  We will get a try Elocon cream 1-2 times daily for the next 7 to 10 days and see the patient's response.  Meanwhile check fasting lab work while the patient is here including a CBC CMP and a lipid panel. ?

## 2021-06-11 LAB — LIPID PANEL
Cholesterol: 162 mg/dL (ref <200)
HDL: 54 mg/dL (ref >=40)
LDL Cholesterol (Calc): 96 mg/dL (calc)
Non-HDL Cholesterol (Calc): 108 mg/dL (calc) (ref <130)
Total CHOL/HDL Ratio: 3 (calc) (ref <5.0)
Triglycerides: 40 mg/dL (ref <150)

## 2021-06-11 LAB — CBC WITH DIFFERENTIAL/PLATELET
Absolute Monocytes: 472 cells/uL (ref 200–950)
Basophils Absolute: 21 cells/uL (ref 0–200)
Basophils Relative: 0.4 %
Eosinophils Absolute: 472 cells/uL (ref 15–500)
Eosinophils Relative: 8.9 %
HCT: 46.2 % (ref 38.5–50.0)
Hemoglobin: 16.3 g/dL (ref 13.2–17.1)
Lymphs Abs: 2003 cells/uL (ref 850–3900)
MCH: 31.3 pg (ref 27.0–33.0)
MCHC: 35.3 g/dL (ref 32.0–36.0)
MCV: 88.8 fL (ref 80.0–100.0)
MPV: 10.4 fL (ref 7.5–12.5)
Monocytes Relative: 8.9 %
Neutro Abs: 2332 cells/uL (ref 1500–7800)
Neutrophils Relative %: 44 %
Platelets: 228 10*3/uL (ref 140–400)
RBC: 5.2 10*6/uL (ref 4.20–5.80)
RDW: 12.2 % (ref 11.0–15.0)
Total Lymphocyte: 37.8 %
WBC: 5.3 10*3/uL (ref 3.8–10.8)

## 2021-06-11 LAB — COMPLETE METABOLIC PANEL WITH GFR
AG Ratio: 2 (calc) (ref 1.0–2.5)
ALT: 31 U/L (ref 9–46)
AST: 23 U/L (ref 10–40)
Albumin: 5.1 g/dL (ref 3.6–5.1)
Alkaline phosphatase (APISO): 32 U/L — ABNORMAL LOW (ref 36–130)
BUN: 19 mg/dL (ref 7–25)
CO2: 27 mmol/L (ref 20–32)
Calcium: 10.1 mg/dL (ref 8.6–10.3)
Chloride: 102 mmol/L (ref 98–110)
Creat: 1.06 mg/dL (ref 0.60–1.29)
Globulin: 2.5 g/dL (calc) (ref 1.9–3.7)
Glucose, Bld: 96 mg/dL (ref 65–99)
Potassium: 4.1 mmol/L (ref 3.5–5.3)
Sodium: 139 mmol/L (ref 135–146)
Total Bilirubin: 0.7 mg/dL (ref 0.2–1.2)
Total Protein: 7.6 g/dL (ref 6.1–8.1)
eGFR: 88 mL/min/{1.73_m2} (ref >=60)

## 2021-07-22 ENCOUNTER — Other Ambulatory Visit: Payer: Self-pay | Admitting: Family Medicine

## 2021-12-18 ENCOUNTER — Other Ambulatory Visit: Payer: Self-pay | Admitting: Family Medicine

## 2021-12-18 DIAGNOSIS — N529 Male erectile dysfunction, unspecified: Secondary | ICD-10-CM

## 2021-12-18 NOTE — Telephone Encounter (Signed)
Requested Prescriptions  Pending Prescriptions Disp Refills  . tadalafil (CIALIS) 10 MG tablet [Pharmacy Med Name: TADALAFIL 10 MG TABLET] 5 tablet 12    Sig: TAKE ONE TABLET BY MOUTH DAILY AS NEEDED FOR ERECTILE DYSFUNCTION.     Urology: Erectile Dysfunction Agents Failed - 12/18/2021  2:48 AM      Failed - Last BP in normal range    BP Readings from Last 1 Encounters:  06/10/21 (!) 132/101         Passed - AST in normal range and within 360 days    AST  Date Value Ref Range Status  06/10/2021 23 10 - 40 U/L Final         Passed - ALT in normal range and within 360 days    ALT  Date Value Ref Range Status  06/10/2021 31 9 - 46 U/L Final         Passed - Valid encounter within last 12 months    Recent Outpatient Visits          6 months ago Essential hypertension   [place] Pickard, Cammie Mcgee, MD   7 months ago Cardiomegaly   [place] Pickard, Cammie Mcgee, MD   1 year ago General medical exam   [place] Susy Frizzle, MD   1 year ago Essential hypertension   [place], Cammie Mcgee, MD   1 year ago Chronic fatigue   [place] Pickard, Cammie Mcgee, MD

## 2022-03-07 ENCOUNTER — Ambulatory Visit: Payer: 59 | Admitting: Family Medicine

## 2022-03-13 ENCOUNTER — Other Ambulatory Visit: Payer: Self-pay | Admitting: Family Medicine

## 2022-05-10 ENCOUNTER — Other Ambulatory Visit: Payer: Self-pay | Admitting: Family Medicine

## 2022-05-12 NOTE — Telephone Encounter (Signed)
Requested medications are due for refill today.  yes  Requested medications are on the active medications list.  yes  Last refill. 05/16/2021 #90 3 rf  Future visit scheduled.   no  Notes to clinic.  Pt is more than 3 months overdue for an office visit.    Requested Prescriptions  Pending Prescriptions Disp Refills   amLODipine (NORVASC) 10 MG tablet [Pharmacy Med Name: AMLODIPINE BESYLATE 10 MG TAB] 30 tablet 11    Sig: TAKE 1 TABLET BY MOUTH EVERY DAY     Cardiovascular: Calcium Channel Blockers 2 Failed - 05/10/2022  1:03 AM      Failed - Last BP in normal range    BP Readings from Last 1 Encounters:  06/10/21 (!) 132/101         Failed - Valid encounter within last 6 months    Recent Outpatient Visits           11 months ago Essential hypertension   [place] Pickard, Cammie Mcgee, MD   1 year ago Cardiomegaly   [place] Pickard, Cammie Mcgee, MD   1 year ago General medical exam   [place] Susy Frizzle, MD   2 years ago Essential hypertension   [place], Cammie Mcgee, MD   2 years ago Chronic fatigue   [place], Warren T, MD              Passed - Last Heart Rate in normal range    Pulse Readings from Last 1 Encounters:  06/10/21 71

## 2022-05-26 ENCOUNTER — Ambulatory Visit: Payer: 59 | Admitting: Family Medicine

## 2022-06-04 ENCOUNTER — Encounter: Payer: Self-pay | Admitting: Family Medicine

## 2022-06-04 ENCOUNTER — Ambulatory Visit: Payer: 59 | Admitting: Family Medicine

## 2022-06-04 VITALS — BP 120/84 | HR 72 | Temp 97.6°F | Ht 70.0 in | Wt 196.0 lb

## 2022-06-04 DIAGNOSIS — M545 Low back pain, unspecified: Secondary | ICD-10-CM

## 2022-06-04 DIAGNOSIS — R300 Dysuria: Secondary | ICD-10-CM

## 2022-06-04 LAB — URINALYSIS, ROUTINE W REFLEX MICROSCOPIC
Bilirubin Urine: NEGATIVE
Glucose, UA: NEGATIVE
Hgb urine dipstick: NEGATIVE
Ketones, ur: NEGATIVE
Leukocytes,Ua: NEGATIVE
Nitrite: NEGATIVE
Protein, ur: NEGATIVE
Specific Gravity, Urine: 1.02 (ref 1.001–1.035)
pH: 7 (ref 5.0–8.0)

## 2022-06-04 MED ORDER — CYCLOBENZAPRINE HCL 10 MG PO TABS: 10.0000 mg | ORAL_TABLET | Freq: Three times a day (TID) | ORAL | 0 refills | Status: DC | PRN

## 2022-06-04 NOTE — Assessment & Plan Note (Signed)
UA negative. Will check gonorrhea/chlamydia. Declines physical exam but denies further symptoms. OK with watchful waiting to see if this resolves. Instructed to return to office if symptoms persist or worsen.

## 2022-06-04 NOTE — Assessment & Plan Note (Signed)
Onset prior to dysuria. No red flags or indication for imaging at this time. Start Flexeril '10mg'$  TID. Return to office if symptoms persist or worsen.
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
## 2022-06-06 LAB — C. TRACHOMATIS/N. GONORRHOEAE RNA
C. trachomatis RNA, TMA: NOT DETECTED
N. gonorrhoeae RNA, TMA: NOT DETECTED

## 2022-06-16 ENCOUNTER — Other Ambulatory Visit: Payer: Self-pay | Admitting: Family Medicine

## 2022-06-17 NOTE — Telephone Encounter (Signed)
Courtesy refill given, appointment needed.   Requested Prescriptions  Pending Prescriptions Disp Refills   amLODipine (NORVASC) 10 MG tablet [Pharmacy Med Name: AMLODIPINE BESYLATE 10 MG TAB] 30 tablet 0    Sig: Take 1 tablet (10 mg total) by mouth daily. MUST KEEP OFFICE VISIT FOR ADDITIONAL REFILLS     Cardiovascular: Calcium Channel Blockers 2 Failed - 06/16/2022  2:36 AM      Failed - Valid encounter within last 6 months    Recent Outpatient Visits           1 year ago Essential hypertension   [place], Cammie Mcgee, MD   1 year ago Cardiomegaly   [place] Scha[place], Cammie Mcgee, MD   1 year ago General medical exam   [place] Susy Frizzle, MD   2 years ago Essential hypertension   [place] Schaumann, Cammie Mcgee, MD   2 years ago Chronic fatigue   [place], Cammie Mcgee, MD       Future Appointments             In 3 weeks Pickard, Cammie Mcgee, MD [facility]            Passed - Last BP in normal range    BP Readings from Last 1 Encounters:  06/04/22 120/84         Passed - Last Heart Rate in normal range    Pulse Readings from Last 1 Encounters:  06/04/22 72

## 2022-07-05 ENCOUNTER — Other Ambulatory Visit: Payer: Self-pay | Admitting: Family Medicine

## 2022-07-07 NOTE — Telephone Encounter (Signed)
Requested medication (s) are due for refill today: yes  Requested medication (s) are on the active medication list: yes  Last refill:  07/22/21 #30/11  Future visit scheduled: no   Notes to clinic:  pt due for CPE and labs. Please advise      Requested Prescriptions  Pending Prescriptions Disp Refills   rosuvastatin (CRESTOR) 20 MG tablet [Pharmacy Med Name: ROSUVASTATIN CALCIUM 20 MG TAB] 30 tablet 11    Sig: TAKE 1 TABLET BY MOUTH EVERY DAY     Cardiovascular:  Antilipid - Statins 2 Failed - 07/05/2022  1:14 AM      Failed - Cr in normal range and within 360 days    Creat  Date Value Ref Range Status  06/10/2021 1.06 0.60 - 1.29 mg/dL Final   Creatinine, Urine  Date Value Ref Range Status  07/19/2020 243 20 - 320 mg/dL Final         Failed - Valid encounter within last 12 months    Recent Outpatient Visits           1 year ago Essential hypertension   [facility] Pickard, Priscille Heidelberg, MD   1 year ago Cardiomegaly   [facility] Pickard, Priscille Heidelberg, MD   1 year ago General medical exam   [facility] Donita Brooks, MD   2 years ago Essential hypertension   [facility] Tanya Nones, Priscille Heidelberg, MD   2 years ago Chronic fatigue   [facility] Pickard, Priscille Heidelberg, MD       Future Appointments             In 3 weeks Tanya Nones, Priscille Heidelberg, MD [facility]            Failed - Lipid Panel in normal range within the last 12 months    Cholesterol  Date Value Ref Range Status  06/10/2021 162 <200 mg/dL Final   LDL Cholesterol (Calc)  Date Value Ref Range Status  06/10/2021 96 mg/dL (calc) Final    Comment:    Reference range: <100 . Desirable range <100 mg/dL for primary prevention;   <70 mg/dL for patients with CHD or diabetic patients  with > or = 2 CHD risk factors. Marland Kitchen LDL-C is now calculated using the Martin-Hopkins  calculation, which is a validated novel  method providing  better accuracy than the Friedewald equation in the  estimation of LDL-C.  Horald Pollen et al. Lenox Ahr. 6122;449(75): 2061-2068  (http://education.[employer].com/faq/FAQ164)    HDL  Date Value Ref Range Status  06/10/2021 54 > OR = 40 mg/dL Final   Triglycerides  Date Value Ref Range Status  06/10/2021 40 <150 mg/dL Final         Passed - Patient is not pregnant

## 2022-07-08 ENCOUNTER — Encounter: Payer: 59 | Admitting: Family Medicine

## 2022-07-14 ENCOUNTER — Other Ambulatory Visit: Payer: Self-pay | Admitting: Family Medicine

## 2022-07-31 ENCOUNTER — Encounter: Payer: Self-pay | Admitting: Family Medicine

## 2022-07-31 ENCOUNTER — Ambulatory Visit (INDEPENDENT_AMBULATORY_CARE_PROVIDER_SITE_OTHER): Payer: 59 | Admitting: Family Medicine

## 2022-07-31 VITALS — BP 134/86 | Ht 70.0 in | Wt 193.0 lb

## 2022-07-31 DIAGNOSIS — Z0001 Encounter for general adult medical examination with abnormal findings: Secondary | ICD-10-CM

## 2022-07-31 DIAGNOSIS — I10 Essential (primary) hypertension: Secondary | ICD-10-CM

## 2022-07-31 DIAGNOSIS — Z0000 Encounter for general adult medical examination without abnormal findings: Secondary | ICD-10-CM

## 2022-07-31 DIAGNOSIS — E7800 Pure hypercholesterolemia, unspecified: Secondary | ICD-10-CM | POA: Diagnosis not present

## 2022-07-31 NOTE — Progress Notes (Signed)
Subjective:    Patient ID: Kevin Paul, male    DOB: October 30, 1975, 47 y.o.   MRN: [id number] Patient is a very pleasant 47 year old Caucasian gentleman who is here today for complete physical.  He denies any concerns.  He is monitoring his blood pressure at home.  His blood pressures consistently less than 140/90.  He denies any chest pain shortness of breath or dyspnea on exertion.  He denies any myalgias or right upper quadrant pain.  He is tolerating rosuvastatin without difficulty.  He is due for a tetanus shot.  He is also due for the shingles vaccine.  He politely defers both of these today.  He had Cologuard in 2022.  He is due again in 2025.  He is not yet due for prostate cancer screening Past Medical History:  Diagnosis Date   Cardiomegaly 08/24/2014   [facility]   COVID-19 virus infection 09/06/2019   COVID-19 virus infection 09/06/2019   Hematuria 08/24/2014   exercise induced- [facility]   HSV-2 infection    Hypertension    Pneumonia    10 years ago   PONV (postoperative nausea and vomiting)    Past Surgical History:  Procedure Laterality Date   ANTERIOR CRUCIATE LIGAMENT REPAIR  07/1994   KNEE ARTHROSCOPY Bilateral    Has had multiple knee surgeries in his Teens-20s   VENTRAL HERNIA REPAIR N/A 01/25/2020   Procedure: open primary supraumbilical ventral hernia repair;  Surgeon: Andria Meuse, MD;  Location: [facility];  Service: General;  Laterality: N/A;   WISDOM TOOTH EXTRACTION     Current Outpatient Medications on File Prior to Visit  Medication Sig Dispense Refill   acyclovir (ZOVIRAX) 400 MG tablet TAKE 1 TABLET (400 MG TOTAL) BY MOUTH 3 (THREE) TIMES DAILY. FOR 5 DAYS 15 tablet 5   amLODipine (NORVASC) 10 MG tablet TAKE 1 TABLET (10 MG TOTAL) BY MOUTH DAILY. MUST KEEP OFFICE VISIT FOR ADDITIONAL REFILLS 30 tablet 0   cyclobenzaprine (FLEXERIL) 10 MG tablet Take 1 tablet (10 mg total) by mouth 3 (three) times daily as needed for muscle spasms. 30 tablet 0    fluticasone (FLONASE) 50 MCG/ACT nasal spray Place 2 sprays into both nostrils daily as needed for rhinitis.     hydrochlorothiazide (HYDRODIURIL) 25 MG tablet TAKE 1 TABLET (25 MG TOTAL) BY MOUTH DAILY. 30 tablet 11   mometasone (ELOCON) 0.1 % cream Apply topically daily. 45 g 1   rosuvastatin (CRESTOR) 20 MG tablet TAKE 1 TABLET BY MOUTH EVERY DAY 30 tablet 11   tadalafil (CIALIS) 10 MG tablet TAKE ONE TABLET BY MOUTH DAILY AS NEEDED FOR ERECTILE DYSFUNCTION. 5 tablet 12   zolpidem (AMBIEN) 10 MG tablet TAKE 1 TABLET BY MOUTH AT BEDTIME AS NEEDED FOR SLEEP (Patient not taking: Reported on 07/31/2022) 30 tablet 3   No current facility-administered medications on file prior to visit.   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 3 children--2 are step children  1 is his own biologic child: Ages 37, 31, 79.   Last year started exercising: Runs 3 miles / day 3-4 days / week.   Also does push-ups, sit ups etc at home.    THIS  INFO  ENTERED  06/22/2013   Social Determinants of Health   Financial Resource Strain: Low Risk  (07/31/2022)   Overall Financial Resource Strain (CARDIA)    Difficulty of Paying Living Expenses: Not hard at all  Food Insecurity: No Food Insecurity (07/31/2022)   Hunger Vital Sign    Worried About Running Out of Food in the Last Year: Never true    Ran Out of Food in the Last  Year: Never true  Transportation Needs: No Transportation Needs (07/31/2022)   PRAPARE - Administrator, Civil Service (Medical): No    Lack of Transportation (Non-Medical): No  Physical Activity: Sufficiently Active (07/31/2022)   Exercise Vital Sign    Days of Exercise per Week: 7 days    Minutes of Exercise per Session: 30 min  Stress: No Stress Concern Present (07/31/2022)   [employer] of Occupational Health - Occupational Stress Questionnaire    Feeling of Stress : Not at all  Social Connections: Socially Integrated (07/31/2022)   Social Connection and Isolation Panel [NHANES]    Frequency of Communication with Friends and Family: More than three times a week    Frequency of Social Gatherings with Friends and Family: More than three times a week    Attends Religious Services: More than 4 times per year    Active Member of [employer] or Organizations: Yes    Attends Banker Meetings: More than 4 times per year    Marital Status: Married  Catering manager Violence: Not At Risk (07/31/2022)   Humiliation, Afraid, Rape, and Kick questionnaire    Fear of Current or Ex-Partner: No    Emotionally Abused: No    Physically Abused: No    Sexually Abused: No     Review of Systems  All other systems reviewed and are negative.      Objective:   Physical Exam Vitals reviewed.  Constitutional:      General: He is not in acute distress.    Appearance: Normal appearance. He is normal weight. He is not ill-appearing or toxic-appearing.  HENT:     Head: Normocephalic and atraumatic.     Right Ear: Tympanic membrane and ear canal normal.     Left Ear: Tympanic membrane and ear canal normal.     Nose: Nose normal. No congestion or rhinorrhea.     Mouth/Throat:     Mouth: Mucous membranes are moist.     Pharynx: No oropharyngeal exudate or posterior oropharyngeal erythema.  Eyes:     General:        Right eye: No discharge.        Left eye: No discharge.      Extraocular Movements: Extraocular movements intact.     Conjunctiva/sclera: Conjunctivae normal.     Pupils: Pupils are equal, round, and reactive to light.  Neck:     Vascular: No carotid bruit.  Cardiovascular:     Rate and Rhythm: Normal rate and regular rhythm.     Heart sounds: Normal heart sounds. No murmur  heard.    No gallop.  Pulmonary:     Effort: Pulmonary effort is normal. No respiratory distress.     Breath sounds: Normal breath sounds. No stridor. No wheezing, rhonchi or rales.  Chest:     Chest wall: No tenderness.  Abdominal:     General: Bowel sounds are normal. There is no distension.     Palpations: Abdomen is soft.     Tenderness: There is no abdominal tenderness. There is no guarding or rebound.  Musculoskeletal:     Cervical back: Normal range of motion and neck supple. No tenderness.     Right lower leg: No edema.     Left lower leg: No edema.  Lymphadenopathy:     Cervical: No cervical adenopathy.  Skin:    General: Skin is warm.     Coloration: Skin is not jaundiced or pale.     Findings: No bruising, erythema, lesion or rash.  Neurological:     General: No focal deficit present.     Mental Status: He is alert and oriented to person, place, and time. Mental status is at baseline.     Cranial Nerves: No cranial nerve deficit.     Motor: No weakness.     Gait: Gait normal.  Psychiatric:        Mood and Affect: Mood normal.        Behavior: Behavior normal.        Thought Content: Thought content normal.        Judgment: Judgment normal.           Assessment & Plan:  Pure hypercholesterolemia - Plan: CBC with Differential/Platelet, Lipid panel, COMPLETE METABOLIC PANEL WITH GFR  Essential hypertension  General medical exam Physical exam today is normal.  His home blood pressures are between 120 and 130 systolic usually.  I am very happy with that.  I will check a CBC a CMP and a lipid panel.  I like to see his LDL cholesterol below 100.   Recommended a tetanus shot as well as a COVID shot but the patient politely deferred these today.  Regular anticipatory guidance is provided

## 2022-08-01 LAB — CBC WITH DIFFERENTIAL/PLATELET
Absolute Monocytes: 546 cells/uL (ref 200–950)
Basophils Absolute: 18 cells/uL (ref 0–200)
Basophils Relative: 0.3 %
Eosinophils Absolute: 210 cells/uL (ref 15–500)
Eosinophils Relative: 3.5 %
HCT: 44.9 % (ref 38.5–50.0)
Hemoglobin: 15.7 g/dL (ref 13.2–17.1)
Lymphs Abs: 1650 cells/uL (ref 850–3900)
MCH: 30.7 pg (ref 27.0–33.0)
MCHC: 35 g/dL (ref 32.0–36.0)
MCV: 87.7 fL (ref 80.0–100.0)
MPV: 10.6 fL (ref 7.5–12.5)
Monocytes Relative: 9.1 %
Neutro Abs: 3576 cells/uL (ref 1500–7800)
Neutrophils Relative %: 59.6 %
Platelets: 258 10*3/uL (ref 140–400)
RBC: 5.12 10*6/uL (ref 4.20–5.80)
RDW: 12.5 % (ref 11.0–15.0)
Total Lymphocyte: 27.5 %
WBC: 6 10*3/uL (ref 3.8–10.8)

## 2022-08-01 LAB — COMPLETE METABOLIC PANEL WITH GFR
AG Ratio: 1.8 (calc) (ref 1.0–2.5)
ALT: 26 U/L (ref 9–46)
AST: 23 U/L (ref 10–40)
Albumin: 4.9 g/dL (ref 3.6–5.1)
Alkaline phosphatase (APISO): 35 U/L — ABNORMAL LOW (ref 36–130)
BUN: 17 mg/dL (ref 7–25)
CO2: 25 mmol/L (ref 20–32)
Calcium: 10.1 mg/dL (ref 8.6–10.3)
Chloride: 101 mmol/L (ref 98–110)
Creat: 1.05 mg/dL (ref 0.60–1.29)
Globulin: 2.7 g/dL (calc) (ref 1.9–3.7)
Glucose, Bld: 94 mg/dL (ref 65–99)
Potassium: 3.9 mmol/L (ref 3.5–5.3)
Sodium: 140 mmol/L (ref 135–146)
Total Bilirubin: 0.6 mg/dL (ref 0.2–1.2)
Total Protein: 7.6 g/dL (ref 6.1–8.1)
eGFR: 88 mL/min/{1.73_m2} (ref >=60)

## 2022-08-01 LAB — LIPID PANEL
Cholesterol: 166 mg/dL (ref <200)
HDL: 61 mg/dL (ref >=40)
LDL Cholesterol (Calc): 91 mg/dL (calc)
Non-HDL Cholesterol (Calc): 105 mg/dL (calc) (ref <130)
Total CHOL/HDL Ratio: 2.7 (calc) (ref <5.0)
Triglycerides: 58 mg/dL (ref <150)

## 2022-08-11 ENCOUNTER — Other Ambulatory Visit: Payer: Self-pay | Admitting: Family Medicine

## 2022-09-09 ENCOUNTER — Other Ambulatory Visit: Payer: Self-pay | Admitting: Family Medicine

## 2022-09-09 NOTE — Telephone Encounter (Signed)
Requested Prescriptions  Pending Prescriptions Disp Refills   amLODipine (NORVASC) 10 MG tablet [Pharmacy Med Name: AMLODIPINE BESYLATE 10 MG TAB] 90 tablet 1    Sig: Take 1 tablet (10 mg total) by mouth daily.     Cardiovascular: Calcium Channel Blockers 2 Failed - 09/09/2022  2:24 AM      Failed - Valid encounter within last 6 months    Recent Outpatient Visits           1 year ago Essential hypertension   [facility] Pickard, Priscille Heidelberg, MD   1 year ago Cardiomegaly   [facility] Tanya Nones, Priscille Heidelberg, MD   2 years ago General medical exam   [facility] Donita Brooks, MD   2 years ago Essential hypertension   [facility] Tanya Nones, Priscille Heidelberg, MD   2 years ago Chronic fatigue   [facility] Pickard, Priscille Heidelberg, MD              Passed - Last BP in normal range    BP Readings from Last 1 Encounters:  07/31/22 134/86         Passed - Last Heart Rate in normal range    Pulse Readings from Last 1 Encounters:  06/04/22 72

## 2022-12-01 ENCOUNTER — Telehealth: Payer: Self-pay

## 2022-12-01 ENCOUNTER — Other Ambulatory Visit: Payer: Self-pay | Admitting: Family Medicine

## 2022-12-01 MED ORDER — SCOPOLAMINE 1 MG/3DAYS TD PT72: 1.0000 | MEDICATED_PATCH | TRANSDERMAL | 12 refills | Status: DC

## 2022-12-01 NOTE — Telephone Encounter (Signed)
Pt came in to ask if pcp would send in a prescription for a motion sickness med. Pt states that he is going on a cruise next week and otc meds have not worked for him in the past.Please advise.  Cb#: [phone]

## 2022-12-01 NOTE — Telephone Encounter (Signed)
Spoke with patient and notified patient prescription was sent to pharmacy. Patient verbalized understanding.

## 2023-01-17 ENCOUNTER — Other Ambulatory Visit: Payer: Self-pay | Admitting: Family Medicine

## 2023-01-17 DIAGNOSIS — N529 Male erectile dysfunction, unspecified: Secondary | ICD-10-CM

## 2023-01-19 NOTE — Telephone Encounter (Signed)
Requested Prescriptions  Pending Prescriptions Disp Refills   tadalafil (CIALIS) 10 MG tablet [Pharmacy Med Name: TADALAFIL 10 MG TABLET] 90 tablet 0    Sig: TAKE ONE TABLET BY MOUTH DAILY AS NEEDED FOR ERECTILE DYSFUNCTION.     Urology: Erectile Dysfunction Agents Failed - 01/19/2023 10:54 AM      Failed - Valid encounter within last 12 months    Recent Outpatient Visits           1 year ago Essential hypertension   [facility] Pickard, Priscille Heidelberg, MD   1 year ago Cardiomegaly   [facility] Tanya Nones, Priscille Heidelberg, MD   2 years ago General medical exam   [facility] Donita Brooks, MD   2 years ago Essential hypertension   [facility] Tanya Nones, Priscille Heidelberg, MD   3 years ago Chronic fatigue   [facility] Pickard, Priscille Heidelberg, MD              Passed - AST in normal range and within 360 days    AST  Date Value Ref Range Status  07/31/2022 23 10 - 40 U/L Final         Passed - ALT in normal range and within 360 days    ALT  Date Value Ref Range Status  07/31/2022 26 9 - 46 U/L Final         Passed - Last BP in normal range    BP Readings from Last 1 Encounters:  07/31/22 134/86

## 2023-01-19 NOTE — Telephone Encounter (Signed)
Pt called in to request a refill of this med tadalafil (CIALIS) 10 MG tablet [[id number]]  LOV: 07/31/22 CPE  PHARMACY: [id number] [place], [place] - [address] [place] [address] Phone: [phone]  Fax: [phone]    CB#: [id number]

## 2023-03-15 ENCOUNTER — Other Ambulatory Visit: Payer: Self-pay | Admitting: Family Medicine

## 2023-04-23 ENCOUNTER — Other Ambulatory Visit: Payer: Self-pay | Admitting: Nurse Practitioner

## 2023-04-23 DIAGNOSIS — Z20828 Contact with and (suspected) exposure to other viral communicable diseases: Secondary | ICD-10-CM

## 2023-04-23 MED ORDER — OSELTAMIVIR PHOSPHATE 75 MG PO CAPS: 75.0000 mg | ORAL_CAPSULE | Freq: Two times a day (BID) | ORAL | 0 refills | Status: DC

## 2023-06-17 ENCOUNTER — Encounter: Payer: Self-pay | Admitting: Family Medicine

## 2023-06-17 ENCOUNTER — Ambulatory Visit: Admitting: Family Medicine

## 2023-06-17 VITALS — BP 132/76 | HR 70 | Temp 97.8°F | Ht 70.0 in | Wt 203.0 lb

## 2023-06-17 DIAGNOSIS — J321 Chronic frontal sinusitis: Secondary | ICD-10-CM | POA: Diagnosis not present

## 2023-06-17 NOTE — Assessment & Plan Note (Signed)
 Symptoms not consistent with bacterial infection, are limited to sinus pressure with no additional symptoms of sinusitis or allergies. Encouraged to switch daily allergy medication from Claritin to Zyrtec and use Flonase daily for additional symptomatic relief. Will obtain x-ray of sinuses for further evaluation.

## 2023-06-17 NOTE — Progress Notes (Signed)
 Subjective:  HPI: Kevin Paul is a 48 y.o. male presenting on 06/17/2023 for Sinus Problem (Pt c/o sinus pain and pressure, worse in last 3-4 months. )   Sinus Problem   Patient is in today for sinus pressure for 3-4 months in his face and forehead. Denies fever, chills, nasal congestion, rhinorrhea, ear pain, dental pain, loss of taste or smell, sneezing, itching or watery eyes.  Has tried flonase and claritin   Review of Systems  All other systems reviewed and are negative.   Relevant past medical history reviewed and updated as indicated.   Past Medical History:  Diagnosis Date   Cardiomegaly 08/24/2014   [facility]   COVID-19 virus infection 09/06/2019   COVID-19 virus infection 09/06/2019   Hematuria 08/24/2014   exercise induced- [facility]   HSV-2 infection    Hypertension    Pneumonia    10 years ago   PONV (postoperative nausea and vomiting)      Past Surgical History:  Procedure Laterality Date   ANTERIOR CRUCIATE LIGAMENT REPAIR  07/1994   KNEE ARTHROSCOPY Bilateral    Has had multiple knee surgeries in his Teens-20s   VENTRAL HERNIA REPAIR N/A 01/25/2020   Procedure: open primary supraumbilical ventral hernia repair;  Surgeon: Andria Meuse, MD;  Location: [facility];  Service: General;  Laterality: N/A;   WISDOM TOOTH EXTRACTION      Allergies and medications reviewed and updated.   Current Outpatient Medications:    acyclovir (ZOVIRAX) 400 MG tablet, TAKE 1 TABLET (400 MG TOTAL) BY MOUTH 3 (THREE) TIMES DAILY. FOR 5 DAYS, Disp: 15 tablet, Rfl: 5   amLODipine (NORVASC) 10 MG tablet, TAKE 1 TABLET BY MOUTH EVERY DAY, Disp: 30 tablet, Rfl: 5   hydrochlorothiazide (HYDRODIURIL) 25 MG tablet, TAKE 1 TABLET (25 MG TOTAL) BY MOUTH DAILY., Disp: 30 tablet, Rfl: 11   rosuvastatin (CRESTOR) 20 MG tablet, TAKE 1 TABLET BY MOUTH EVERY DAY, Disp: 30 tablet, Rfl: 11   tadalafil (CIALIS) 10 MG tablet, TAKE ONE TABLET BY MOUTH DAILY AS NEEDED FOR  ERECTILE DYSFUNCTION., Disp: 90 tablet, Rfl: 0   cyclobenzaprine (FLEXERIL) 10 MG tablet, Take 1 tablet (10 mg total) by mouth 3 (three) times daily as needed for muscle spasms. (Patient not taking: Reported on 06/17/2023), Disp: 30 tablet, Rfl: 0   fluticasone (FLONASE) 50 MCG/ACT nasal spray, Place 2 sprays into both nostrils daily as needed for rhinitis. (Patient not taking: Reported on 06/17/2023), Disp: , Rfl:    mometasone (ELOCON) 0.1 % cream, Apply topically daily. (Patient not taking: Reported on 06/17/2023), Disp: 45 g, Rfl: 1   oseltamivir (TAMIFLU) 75 MG capsule, Take 1 capsule (75 mg total) by mouth 2 (two) times daily. Take BID for 5 days.  Take with food. (Patient not taking: Reported on 06/17/2023), Disp: 10 capsule, Rfl: 0   scopolamine (TRANSDERM-SCOP) 1 MG/3DAYS, Place 1 patch (1.5 mg total) onto the skin every 3 (three) days. (Patient not taking: Reported on 06/17/2023), Disp: 10 patch, Rfl: 12   zolpidem (AMBIEN) 10 MG tablet, TAKE 1 TABLET BY MOUTH AT BEDTIME AS NEEDED FOR SLEEP (Patient not taking: Reported on 06/17/2023), Disp: 30 tablet, Rfl: 3  No Known Allergies  Objective:   BP 132/76   Pulse 70   Temp 97.8 F (36.6 C)   Ht 5\' 10"  (1.778 m)   Wt 203 lb (92.1 kg)   SpO2 99%   BMI 29.13 kg/m      06/17/2023   11:28  AM 07/31/2022   10:21 AM 06/04/2022    8:58 AM  Vitals with BMI  Height 5\' 10"  5\' 10"  5\' 10"   Weight 203 lbs 193 lbs 196 lbs  BMI 29.13 27.69 28.12  Systolic 132 134 829  Diastolic 76 86 84  Pulse 70  72     Physical Exam Vitals and nursing note reviewed.  Constitutional:      Appearance: Normal appearance. He is normal weight.  HENT:     Head: Normocephalic and atraumatic.     Right Ear: Tympanic membrane, ear canal and external ear normal.     Left Ear: Tympanic membrane, ear canal and external ear normal.     Nose: Nose normal.     Right Sinus: No maxillary sinus tenderness or frontal sinus tenderness.     Left Sinus: No maxillary sinus  tenderness or frontal sinus tenderness.     Mouth/Throat:     Mouth: Mucous membranes are moist.     Pharynx: Oropharynx is clear.  Eyes:     Extraocular Movements: Extraocular movements intact.     Conjunctiva/sclera: Conjunctivae normal.     Pupils: Pupils are equal, round, and reactive to light.  Musculoskeletal:     Cervical back: No tenderness.  Lymphadenopathy:     Cervical: No cervical adenopathy.  Skin:    General: Skin is warm and dry.     Capillary Refill: Capillary refill takes less than 2 seconds.  Neurological:     General: No focal deficit present.     Mental Status: He is alert and oriented to person, place, and time. Mental status is at baseline.  Psychiatric:        Mood and Affect: Mood normal.        Behavior: Behavior normal.        Thought Content: Thought content normal.        Judgment: Judgment normal.     Assessment & Plan:  Chronic frontal sinusitis Assessment & Plan: Symptoms not consistent with bacterial infection, are limited to sinus pressure with no additional symptoms of sinusitis or allergies. Encouraged to switch daily allergy medication from Claritin to Zyrtec and use Flonase daily for additional symptomatic relief. Will obtain x-ray of sinuses for further evaluation.   Orders: -     DG Sinus 1-2 Views; Future     Follow up plan: Return if symptoms worsen or fail to improve.  Park Meo, FNP

## 2023-07-14 ENCOUNTER — Other Ambulatory Visit: Payer: Self-pay | Admitting: Family Medicine

## 2023-07-14 NOTE — Telephone Encounter (Signed)
 Requested Prescriptions  Pending Prescriptions Disp Refills   rosuvastatin  (CRESTOR ) 20 MG tablet [Pharmacy Med Name: ROSUVASTATIN  CALCIUM  20 MG TAB] 90 tablet 0    Sig: TAKE 1 TABLET BY MOUTH EVERY DAY     Cardiovascular:  Antilipid - Statins 2 Failed - 07/14/2023  4:13 PM      Failed - Lipid Panel in normal range within the last 12 months    Cholesterol  Date Value Ref Range Status  07/31/2022 166 <200 mg/dL Final   LDL Cholesterol (Calc)  Date Value Ref Range Status  07/31/2022 91 mg/dL (calc) Final    Comment:    Reference range: <100 . Desirable range <100 mg/dL for primary prevention;   <70 mg/dL for patients with CHD or diabetic patients  with > or = 2 CHD risk factors. Aaron Aas LDL-C is now calculated using the Martin-Hopkins  calculation, which is a validated novel method providing  better accuracy than the Friedewald equation in the  estimation of LDL-C.  Melinda Sprawls et al. Erroll Heard. 4696;295(28): 2061-2068  (http://education.[employer].com/faq/FAQ164)    HDL  Date Value Ref Range Status  07/31/2022 61 > OR = 40 mg/dL Final   Triglycerides  Date Value Ref Range Status  07/31/2022 58 <150 mg/dL Final         Passed - Cr in normal range and within 360 days    Creat  Date Value Ref Range Status  07/31/2022 1.05 0.60 - 1.29 mg/dL Final   Creatinine, Urine  Date Value Ref Range Status  07/19/2020 243 20 - 320 mg/dL Final         Passed - Patient is not pregnant      Passed - Valid encounter within last 12 months    Recent Outpatient Visits           3 weeks ago Chronic frontal sinusitis   [facility] Jenelle Mis, FNP   11 months ago Pure hypercholesterolemia   [facility] Pickard, Cisco Crest, MD   1 year ago Dysuria    [facility] Jenelle Mis, [place]

## 2023-09-14 ENCOUNTER — Telehealth: Payer: Self-pay

## 2023-09-14 ENCOUNTER — Other Ambulatory Visit: Payer: Self-pay

## 2023-09-14 MED ORDER — AMLODIPINE BESYLATE 10 MG PO TABS: 10.0000 mg | ORAL_TABLET | Freq: Every day | ORAL | 5 refills | Status: DC

## 2023-09-14 NOTE — Telephone Encounter (Signed)
 Prescription Request  09/14/2023  LOV: 07/31/22  What is the name of the medication or equipment? amLODipine  (NORVASC ) 10 MG tablet [[id number]]   Have you contacted your pharmacy to request a refill? Yes   Which pharmacy would you like this sent to?  [id number] [place], [place] - [address] [place]  [address] Phone: [phone] Fax: [phone]    Patient notified that their request is being sent to the clinical staff for review and that they should receive a response within 2 business days.   Please advise at [facility] [phone]

## 2023-10-14 ENCOUNTER — Other Ambulatory Visit: Payer: Self-pay | Admitting: Family Medicine

## 2023-10-22 ENCOUNTER — Encounter: Payer: Self-pay | Admitting: Family Medicine

## 2023-10-22 ENCOUNTER — Ambulatory Visit (INDEPENDENT_AMBULATORY_CARE_PROVIDER_SITE_OTHER): Admitting: Family Medicine

## 2023-10-22 VITALS — BP 132/86 | HR 74 | Temp 97.6°F | Ht 70.0 in | Wt 201.8 lb

## 2023-10-22 DIAGNOSIS — Z0000 Encounter for general adult medical examination without abnormal findings: Secondary | ICD-10-CM

## 2023-10-22 DIAGNOSIS — E7800 Pure hypercholesterolemia, unspecified: Secondary | ICD-10-CM

## 2023-10-22 DIAGNOSIS — Z23 Encounter for immunization: Secondary | ICD-10-CM

## 2023-10-22 DIAGNOSIS — Z1211 Encounter for screening for malignant neoplasm of colon: Secondary | ICD-10-CM

## 2023-10-22 DIAGNOSIS — I10 Essential (primary) hypertension: Secondary | ICD-10-CM | POA: Diagnosis not present

## 2023-10-22 DIAGNOSIS — Z0001 Encounter for general adult medical examination with abnormal findings: Secondary | ICD-10-CM

## 2023-10-22 NOTE — Progress Notes (Signed)
 Subjective:    Patient ID: Kevin Paul, male    DOB: 1975-04-26, 48 y.o.   MRN: [id number] Patient is a very pleasant 48 year old Caucasian gentleman who is here today for complete physical.  He denies any concerns.  He had Cologuard in 2022.  He is due again in 2025.  He is not yet due for prostate cancer screening.  He is overdue for a tetanus shot.  He would like to get this today.  He has a strong family history of heart disease.  His father has had heart disease.  His brother has had a heart attack within the last couple of years.  He denies any chest pain or shortness of breath.  He is on max dose statin.  However he is interested in possibly getting a coronary artery calcium  score to risk stratify himself further Past Medical History:  Diagnosis Date   Cardiomegaly 08/24/2014   [facility]   COVID-19 virus infection 09/06/2019   COVID-19 virus infection 09/06/2019   Hematuria 08/24/2014   exercise induced- [facility]   HSV-2 infection    Hypertension    Pneumonia    10 years ago   PONV (postoperative nausea and vomiting)    Past Surgical History:  Procedure Laterality Date   ANTERIOR CRUCIATE LIGAMENT REPAIR  07/1994   KNEE ARTHROSCOPY Bilateral    Has had multiple knee surgeries in his Teens-20s   VENTRAL HERNIA REPAIR N/A 01/25/2020   Procedure: open primary supraumbilical ventral hernia repair;  Surgeon: Teresa Lonni HERO, MD;  Location: [facility];  Service: General;  Laterality: N/A;   WISDOM TOOTH EXTRACTION     Current Outpatient Medications on File Prior to Visit  Medication Sig Dispense Refill   acyclovir  (ZOVIRAX ) 400 MG tablet TAKE 1 TABLET (400 MG TOTAL) BY MOUTH 3 (THREE) TIMES DAILY. FOR 5 DAYS 15 tablet 5   amLODipine  (NORVASC ) 10 MG tablet Take 1 tablet (10 mg total) by mouth daily. 30 tablet 5   hydrochlorothiazide  (HYDRODIURIL ) 25 MG tablet TAKE 1 TABLET (25 MG TOTAL) BY MOUTH DAILY. 30 tablet 11   rosuvastatin  (CRESTOR ) 20 MG tablet TAKE 1 TABLET BY  MOUTH EVERY DAY 30 tablet 2   tadalafil  (CIALIS ) 10 MG tablet TAKE ONE TABLET BY MOUTH DAILY AS NEEDED FOR ERECTILE DYSFUNCTION. 90 tablet 0   cyclobenzaprine  (FLEXERIL ) 10 MG tablet Take 1 tablet (10 mg total) by mouth 3 (three) times daily as needed for muscle spasms. (Patient not taking: Reported on 06/17/2023) 30 tablet 0   fluticasone (FLONASE) 50 MCG/ACT nasal spray Place 2 sprays into both nostrils daily as needed for rhinitis. (Patient not taking: Reported on 06/17/2023)     mometasone  (ELOCON ) 0.1 % cream Apply topically daily. (Patient not taking: Reported on 06/17/2023) 45 g 1   oseltamivir  (TAMIFLU ) 75 MG capsule Take 1 capsule (75 mg total) by mouth 2 (two) times daily. Take BID for 5 days.  Take with food. (Patient not taking: Reported on 06/17/2023) 10 capsule 0   scopolamine  (TRANSDERM-SCOP) 1 MG/3DAYS Place 1 patch (1.5 mg total) onto the skin every 3 (three) days. (Patient not taking: Reported on 06/17/2023) 10 patch 12   zolpidem  (AMBIEN ) 10 MG tablet TAKE 1 TABLET BY MOUTH AT BEDTIME AS NEEDED FOR SLEEP (Patient not taking: Reported on 10/22/2023) 30 tablet 3   No current facility-administered medications on file prior to visit.   No Known Allergies Social History   Socioeconomic History   Marital status: Married    Spouse  name: Not on file   Number of children: Not on file   Years of education: Not on file   Highest education level: Associate degree: occupational, Scientist, product/process development, or vocational program  Occupational History   Not on file  Tobacco Use   Smoking status: Never   Smokeless tobacco: Never  Vaping Use   Vaping status: Never Used  Substance and Sexual Activity   Alcohol use: Yes    Alcohol/week: 4.0 standard drinks of alcohol    Types: 4 Cans of beer per week   Drug use: No   Sexual activity: Yes  Other Topics Concern   Not on file  Social History Narrative   Paramedic for [employer]   Married. 3 children--2 are step children  1 is his own biologic child:  Ages 70, 53, 67.   Last year started exercising: Runs 3 miles / day 3-4 days / week.   Also does push-ups, sit ups etc at home.    THIS  INFO  ENTERED  06/22/2013   Social Drivers of Health   Financial Resource Strain: Low Risk  (10/20/2023)   Overall Financial Resource Strain (CARDIA)    Difficulty of Paying Living Expenses: Not hard at all  Food Insecurity: No Food Insecurity (10/20/2023)   Hunger Vital Sign    Worried About Running Out of Food in the Last Year: Never true    Ran Out of Food in the Last Year: Never true  Transportation Needs: No Transportation Needs (10/20/2023)   PRAPARE - Administrator, Civil Service (Medical): No    Lack of Transportation (Non-Medical): No  Physical Activity: Insufficiently Active (10/20/2023)   Exercise Vital Sign    Days of Exercise per Week: 3 days    Minutes of Exercise per Session: 30 min  Stress: No Stress Concern Present (10/20/2023)   [employer] of Occupational Health - Occupational Stress Questionnaire    Feeling of Stress: Not at all  Social Connections: Socially Integrated (10/20/2023)   Social Connection and Isolation Panel    Frequency of Communication with Friends and Family: Three times a week    Frequency of Social Gatherings with Friends and Family: Twice a week    Attends Religious Services: More than 4 times per year    Active Member of [employer] or Organizations: Yes    Attends Engineer, structural: More than 4 times per year    Marital Status: Married  Catering manager Violence: Not At Risk (07/31/2022)   Humiliation, Afraid, Rape, and Kick questionnaire    Fear of Current or Ex-Partner: No    Emotionally Abused: No    Physically Abused: No    Sexually Abused: No     Review of Systems  All other systems reviewed and are negative.      Objective:   Physical Exam Vitals reviewed.  Constitutional:      General: He is not in acute distress.    Appearance: Normal appearance. He is normal weight.  He is not ill-appearing or toxic-appearing.  HENT:     Head: Normocephalic and atraumatic.     Right Ear: Tympanic membrane and ear canal normal.     Left Ear: Tympanic membrane and ear canal normal.     Nose: Nose normal. No congestion or rhinorrhea.     Mouth/Throat:     Mouth: Mucous membranes are moist.     Pharynx: No oropharyngeal exudate or posterior oropharyngeal erythema.  Eyes:     General:  Right eye: No discharge.        Left eye: No discharge.     Extraocular Movements: Extraocular movements intact.     Conjunctiva/sclera: Conjunctivae normal.     Pupils: Pupils are equal, round, and reactive to light.  Neck:     Vascular: No carotid bruit.  Cardiovascular:     Rate and Rhythm: Normal rate and regular rhythm.     Heart sounds: Normal heart sounds. No murmur heard.    No gallop.  Pulmonary:     Effort: Pulmonary effort is normal. No respiratory distress.     Breath sounds: Normal breath sounds. No stridor. No wheezing, rhonchi or rales.  Chest:     Chest wall: No tenderness.  Abdominal:     General: Bowel sounds are normal. There is no distension.     Palpations: Abdomen is soft.     Tenderness: There is no abdominal tenderness. There is no guarding or rebound.  Musculoskeletal:     Cervical back: Normal range of motion and neck supple. No tenderness.     Right lower leg: No edema.     Left lower leg: No edema.  Lymphadenopathy:     Cervical: No cervical adenopathy.  Skin:    General: Skin is warm.     Coloration: Skin is not jaundiced or pale.     Findings: No bruising, erythema, lesion or rash.  Neurological:     General: No focal deficit present.     Mental Status: He is alert and oriented to person, place, and time. Mental status is at baseline.     Cranial Nerves: No cranial nerve deficit.     Motor: No weakness.     Gait: Gait normal.  Psychiatric:        Mood and Affect: Mood normal.        Behavior: Behavior normal.        Thought Content:  Thought content normal.        Judgment: Judgment normal.           Assessment & Plan:  General medical exam  Essential hypertension - Plan: CBC with Differential/Platelet, Comprehensive metabolic panel with GFR, Lipid panel  Pure hypercholesterolemia - Plan: CT CARDIAC SCORING (SELF PAY ONLY), CBC with Differential/Platelet, Comprehensive metabolic panel with GFR, Lipid panel  Colon cancer screening - Plan: Cologuard I am very happy with his blood pressure.  We will order a coronary artery calcium  score.  If there is plaque forming in his coronary arteries I will try to drop his LDL to less than 70.  If there is substantial plaque greater than 90th percentile, we may want to establish the patient with a cardiologist.  Schedule the patient for Cologuard.  Patient is not yet due for prostate cancer screening.  Patient received his tetanus shot today.

## 2023-10-22 NOTE — Addendum Note (Signed)
 Addended by: ANGELENA RONAL BRADLEY K on: 10/22/2023 08:19 AM   Modules accepted: Orders

## 2023-10-23 ENCOUNTER — Ambulatory Visit: Payer: Self-pay | Admitting: Family Medicine

## 2023-10-23 LAB — CBC WITH DIFFERENTIAL/PLATELET
Absolute Lymphocytes: 2602 {cells}/uL (ref 850–3900)
Absolute Monocytes: 592 {cells}/uL (ref 200–950)
Basophils Absolute: 32 {cells}/uL (ref 0–200)
Basophils Relative: 0.5 %
Eosinophils Absolute: 290 {cells}/uL (ref 15–500)
Eosinophils Relative: 4.6 %
HCT: 47 % (ref 38.5–50.0)
Hemoglobin: 16.3 g/dL (ref 13.2–17.1)
MCH: 31.8 pg (ref 27.0–33.0)
MCHC: 34.7 g/dL (ref 32.0–36.0)
MCV: 91.8 fL (ref 80.0–100.0)
MPV: 10.2 fL (ref 7.5–12.5)
Monocytes Relative: 9.4 %
Neutro Abs: 2785 {cells}/uL (ref 1500–7800)
Neutrophils Relative %: 44.2 %
Platelets: 210 Thousand/uL (ref 140–400)
RBC: 5.12 Million/uL (ref 4.20–5.80)
RDW: 12.7 % (ref 11.0–15.0)
Total Lymphocyte: 41.3 %
WBC: 6.3 Thousand/uL (ref 3.8–10.8)

## 2023-10-23 LAB — COMPREHENSIVE METABOLIC PANEL WITH GFR
AG Ratio: 2.1 (calc) (ref 1.0–2.5)
ALT: 42 U/L (ref 9–46)
AST: 31 U/L (ref 10–40)
Albumin: 5 g/dL (ref 3.6–5.1)
Alkaline phosphatase (APISO): 34 U/L — ABNORMAL LOW (ref 36–130)
BUN: 13 mg/dL (ref 7–25)
CO2: 27 mmol/L (ref 20–32)
Calcium: 9.7 mg/dL (ref 8.6–10.3)
Chloride: 101 mmol/L (ref 98–110)
Creat: 1.13 mg/dL (ref 0.60–1.29)
Globulin: 2.4 g/dL (ref 1.9–3.7)
Glucose, Bld: 94 mg/dL (ref 65–99)
Potassium: 3.8 mmol/L (ref 3.5–5.3)
Sodium: 138 mmol/L (ref 135–146)
Total Bilirubin: 0.6 mg/dL (ref 0.2–1.2)
Total Protein: 7.4 g/dL (ref 6.1–8.1)
eGFR: 80 mL/min/1.73m2 (ref >=60)

## 2023-10-23 LAB — LIPID PANEL
Cholesterol: 156 mg/dL (ref <200)
HDL: 59 mg/dL (ref >=40)
LDL Cholesterol (Calc): 83 mg/dL
Non-HDL Cholesterol (Calc): 97 mg/dL (ref <130)
Total CHOL/HDL Ratio: 2.6 (calc) (ref <5.0)
Triglycerides: 63 mg/dL (ref <150)

## 2023-11-06 LAB — COLOGUARD: COLOGUARD: NEGATIVE

## 2023-11-10 NOTE — Progress Notes (Signed)
 Sent to patient in my chart result letter .

## 2023-11-24 ENCOUNTER — Ambulatory Visit (HOSPITAL_BASED_OUTPATIENT_CLINIC_OR_DEPARTMENT_OTHER)
Admission: RE | Admit: 2023-11-24 | Discharge: 2023-11-24 | Disposition: A | Discharge status: Home / Self Care | Payer: Self-pay | Source: Ambulatory Visit | Attending: Family Medicine | Admitting: Family Medicine

## 2023-11-24 DIAGNOSIS — E7800 Pure hypercholesterolemia, unspecified: Secondary | ICD-10-CM | POA: Insufficient documentation

## 2023-11-27 ENCOUNTER — Other Ambulatory Visit: Payer: Self-pay

## 2023-11-27 DIAGNOSIS — Z8249 Family history of ischemic heart disease and other diseases of the circulatory system: Secondary | ICD-10-CM

## 2023-11-27 DIAGNOSIS — R931 Abnormal findings on diagnostic imaging of heart and coronary circulation: Secondary | ICD-10-CM

## 2023-11-27 DIAGNOSIS — E7800 Pure hypercholesterolemia, unspecified: Secondary | ICD-10-CM

## 2023-11-27 MED ORDER — ROSUVASTATIN CALCIUM 20 MG PO TABS: 20.0000 mg | ORAL_TABLET | Freq: Every day | ORAL | 2 refills | Status: DC

## 2023-11-27 MED ORDER — ASPIRIN 81 MG PO TBEC
81.0000 mg | DELAYED_RELEASE_TABLET | Freq: Every day | ORAL | Status: AC
Start: 2023-11-27 — End: ?

## 2023-12-04 ENCOUNTER — Other Ambulatory Visit: Payer: Self-pay

## 2023-12-04 DIAGNOSIS — E7800 Pure hypercholesterolemia, unspecified: Secondary | ICD-10-CM

## 2023-12-04 DIAGNOSIS — Z8249 Family history of ischemic heart disease and other diseases of the circulatory system: Secondary | ICD-10-CM

## 2023-12-04 MED ORDER — EZETIMIBE 10 MG PO TABS: 10.0000 mg | ORAL_TABLET | Freq: Every day | ORAL | 2 refills | Status: DC

## 2024-01-26 ENCOUNTER — Other Ambulatory Visit: Payer: Self-pay | Admitting: Family Medicine

## 2024-01-26 DIAGNOSIS — N529 Male erectile dysfunction, unspecified: Secondary | ICD-10-CM

## 2024-02-05 ENCOUNTER — Encounter: Payer: Self-pay | Admitting: Family Medicine

## 2024-02-05 ENCOUNTER — Ambulatory Visit: Admitting: Family Medicine

## 2024-02-05 ENCOUNTER — Other Ambulatory Visit: Payer: Self-pay

## 2024-02-05 VITALS — BP 142/100 | HR 94 | Temp 97.8°F | Ht 70.0 in | Wt 200.4 lb

## 2024-02-05 DIAGNOSIS — F4322 Adjustment disorder with anxiety: Secondary | ICD-10-CM | POA: Diagnosis not present

## 2024-02-05 DIAGNOSIS — I10 Essential (primary) hypertension: Secondary | ICD-10-CM

## 2024-02-05 DIAGNOSIS — Z8249 Family history of ischemic heart disease and other diseases of the circulatory system: Secondary | ICD-10-CM | POA: Diagnosis not present

## 2024-02-05 DIAGNOSIS — R040 Epistaxis: Secondary | ICD-10-CM

## 2024-02-05 MED ORDER — LOSARTAN POTASSIUM-HCTZ 50-12.5 MG PO TABS: 1.0000 | ORAL_TABLET | Freq: Every day | ORAL | 1 refills | Status: AC

## 2024-02-05 MED ORDER — SERTRALINE HCL 25 MG PO TABS: 25.0000 mg | ORAL_TABLET | Freq: Every day | ORAL | 1 refills | Status: AC

## 2024-02-05 NOTE — Progress Notes (Signed)
 Patient Office Visit  Assessment & Plan:  Essential hypertension  Epistaxis  Family history of coronary artery disease -     Ambulatory referral to Cardiology  Adjustment disorder with anxiety -     Sertraline HCl; Take 1 tablet (25 mg total) by mouth daily.  Dispense: 90 tablet; Refill: 1  Other orders -     Losartan Potassium-HCTZ; Take 1 tablet by mouth daily.  Dispense: 90 tablet; Refill: 1   Assessment and Plan    Essential hypertension Hypertension not well-controlled. Stress from job may contribute. Discussed switching to losartan-hydrochlorothiazide  to improve control. - Prescribed losartan-hydrochlorothiazide  combination pill. - Continue amlodipine . - Discontinue hydrochlorothiazide  once combination pill is obtained.  Dyslipidemia with abnormal coronary artery calcium  score Dyslipidemia with elevated coronary artery calcium  score. Family history of cardiovascular disease. Discussed cardiology referral. - Continue Crestor  and Zetia . - Referred to cardiology for further evaluation.  Epistaxis Recurrent epistaxis possibly related to aspirin  use. Discussed stopping aspirin  to assess impact. - Discontinued aspirin  temporarily. - Use saline nasal spray and humidifier. - Consider ENT referral if epistaxis persists.  Adjustment disorder with anxiety Experiencing stress and anxiety related to job. Discussed starting Zoloft to manage symptoms and improve blood pressure control. Explained potential side effects and dose adjustment. - Prescribed Zoloft 25 mg daily. - Increase to 50 mg if no improvement after two weeks. - Follow up in 4-6 weeks to assess response.          No follow-ups on file.   Subjective:    Patient ID: RANGEL ECHEVERRI, male    DOB: 08-02-1975  Age: 48 y.o. MRN: [id number]  Chief Complaint  Patient presents with   Epistaxis    Pt states he has had many nose bleeds over the last couple of weeks.     Epistaxis    Discussed the use of AI  scribe software for clinical note transcription with the patient, who gave verbal consent to proceed.  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 He is currently taking a diuretic. There have been no changes in diet, increased salt intake, or use of anti-inflammatories or supplements. He is proactive in managing his health, taking Crestor  and Zetia  for cholesterol management and a baby aspirin  daily. A recent high calcium  score prompted the addition of Zetia  to his regimen.  He has experienced recurrent epistaxis over the past month and a half, with four to five episodes, including a significant one lasting 30 minutes. He has not used nasal sprays recently and has been using saline spray and a humidifier to manage nasal dryness. He also mentions a history of a broken nose, which may contribute to nasal issues.  He reports persistent headaches and wonders if they are related to his blood pressure or stress. He has tried Flonase in the past without relief.  He describes his job as highly stressful, contributing to feelings of anger and frustration. He exercises two to three times a week, including running and weight training, and reports good sleep quality at home. He has a supportive family life and is looking forward to retirement in three and a half years.  He has a significant family history of cardiovascular disease, with his grandfather having a stroke at 30,  his father having a heart attack in his 34s, and his brother having a heart attack at 79.      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 He is currently taking a diuretic. There have been no changes in diet, increased salt intake, or use of anti-inflammatories or supplements. He is proactive in managing his health, taking Crestor  and Zetia  for cholesterol management and a baby aspirin  daily. A recent high calcium  score prompted the addition of Zetia  to his regimen.  He has experienced recurrent epistaxis over the past month and a half, with four to five episodes, including a significant one lasting 30 minutes. He has not used nasal sprays recently and has been using saline spray and a humidifier to manage nasal dryness. He also mentions a history of a broken nose, which may contribute to nasal issues.  He reports persistent headaches and wonders if they are related to his blood pressure or stress. He has tried Flonase in the past without relief.  He describes his job as highly stressful, contributing to feelings of anger and frustration. He exercises two to three times a week, including running and weight training, and reports good sleep quality at home. He has a supportive family life and is looking forward to retirement in three and a half years.  He has a significant family history of cardiovascular disease, with his grandfather having a stroke at 17, his father having a heart attack in his 49s, and his brother having a heart attack at 83.  Physical Exam HEENT: Cerumen present in nasal cavity. Nasal mucosa swollen. Nasal septum deviated.  Results LABS LDL: 83 (09/2023)  RADIOLOGY Coronary artery calcium  score: elevated  Assessment and Plan Essential hypertension Hypertension not well-controlled. Stress from job may contribute. Discussed switching to losartan-hydrochlorothiazide  to improve control. - Prescribed losartan-hydrochlorothiazide  combination pill. -  Continue amlodipine . - Discontinue hydrochlorothiazide  once combination pill is obtained.  Dyslipidemia with abnormal coronary artery calcium  score Dyslipidemia with elevated coronary artery calcium  score. Family history of cardiovascular disease. Discussed cardiology referral. - Continue Crestor  and Zetia . - Referred to cardiology for further evaluation.  Epistaxis Recurrent epistaxis possibly related to aspirin  use. Discussed stopping aspirin  to assess impact. - Discontinued aspirin  temporarily. - Use saline nasal spray and humidifier. - Consider ENT referral if epistaxis persists.  Adjustment disorder with anxiety Experiencing stress and anxiety related to job. Discussed starting Zoloft to manage symptoms and improve blood pressure control. Explained potential side effects and dose adjustment. - Prescribed Zoloft 25 mg daily. - Increase to 50 mg if no improvement after two weeks. - Follow up in 4-6 weeks to assess response.   The 10-year ASCVD risk score (Arnett DK, et al., 2019) is: 2.6%  Past Medical History:  Diagnosis Date   Cardiomegaly 08/24/2014   [facility]   COVID-19 virus infection 09/06/2019   COVID-19 virus infection 09/06/2019   Hematuria 08/24/2014   exercise induced- [facility]   HSV-2 infection    Hypertension    Pneumonia    10 years ago   PONV (postoperative nausea and vomiting)    Past Surgical History:  Procedure Laterality Date   ANTERIOR CRUCIATE LIGAMENT REPAIR  07/1994   KNEE ARTHROSCOPY Bilateral    Has had multiple knee  surgeries in his Teens-20s   VENTRAL HERNIA REPAIR N/A 01/25/2020   Procedure: open primary supraumbilical ventral hernia repair;  Surgeon: Teresa Lonni HERO, MD;  Location: [facility];  Service: General;  Laterality: N/A;   WISDOM TOOTH EXTRACTION     Social History   Tobacco Use   Smoking status: Never   Smokeless tobacco: Never  Vaping Use   Vaping status: Never Used  Substance Use Topics   Alcohol use: Yes     Alcohol/week: 4.0 standard drinks of alcohol    Types: 4 Cans of beer per week   Drug use: No   Family History  Problem Relation Age of Onset   Hyperlipidemia Father    Hypertension Father    Heart disease Father 17       MI--stent   Heart attack Father    Heart attack Brother    Stroke Paternal Grandfather 30       Stroke   Colon polyps Neg Hx    Esophageal cancer Neg Hx    Inflammatory bowel disease Neg Hx    Liver disease Neg Hx    Pancreatic cancer Neg Hx    Rectal cancer Neg Hx    Stomach cancer Neg Hx    No Known Allergies  Review of Systems  HENT:  Positive for nosebleeds.       Objective:    BP (!) 142/100   Pulse 94   Temp 97.8 F (36.6 C)   Ht 5' 10 (1.778 m)   Wt 200 lb 6 oz (90.9 kg)   SpO2 98%   BMI 28.75 kg/m  BP Readings from Last 3 Encounters:  02/05/24 (!) 142/100  10/22/23 132/86  06/17/23 132/76   Wt Readings from Last 3 Encounters:  02/05/24 200 lb 6 oz (90.9 kg)  10/22/23 201 lb 12.8 oz (91.5 kg)  06/17/23 203 lb (92.1 kg)    Physical Exam Vitals and nursing note reviewed.  Constitutional:      General: He is not in acute distress.    Appearance: Normal appearance.  HENT:     Head: Normocephalic.     Right Ear: Tympanic membrane, ear canal and external ear normal.     Left Ear: Tympanic membrane, ear canal and external ear normal.     Nose: Congestion present.     Right Nostril: No epistaxis.     Left Nostril: No epistaxis.     Right Turbinates: Swollen.     Left Turbinates: Not enlarged.     Comments: Patient has deviated septum Eyes:     Extraocular Movements: Extraocular movements intact.     Conjunctiva/sclera: Conjunctivae normal.     Pupils: Pupils are equal, round, and reactive to light.  Cardiovascular:     Rate and Rhythm: Normal rate and regular rhythm.     Heart sounds: Normal heart sounds.  Pulmonary:     Effort: Pulmonary effort is normal.     Breath sounds: Normal breath sounds.  Musculoskeletal:      Right lower leg: No edema.     Left lower leg: No edema.  Neurological:     General: No focal deficit present.     Mental Status: He is alert and oriented to person, place, and time.  Psychiatric:        Mood and Affect: Mood normal.        Behavior: Behavior normal.      No results found for any visits on 02/05/24.

## 2024-02-15 ENCOUNTER — Ambulatory Visit (INDEPENDENT_AMBULATORY_CARE_PROVIDER_SITE_OTHER): Admitting: Otolaryngology

## 2024-02-15 VITALS — BP 129/89 | HR 78 | Ht 70.0 in | Wt 195.0 lb

## 2024-02-15 DIAGNOSIS — R040 Epistaxis: Secondary | ICD-10-CM

## 2024-02-15 MED ORDER — MUPIROCIN 2 % EX OINT: 1.0000 | TOPICAL_OINTMENT | Freq: Two times a day (BID) | CUTANEOUS | 0 refills | Status: AC

## 2024-02-15 NOTE — Patient Instructions (Signed)
 Mupirocin  ointment: Apply a pea sized amount twice daily just to inside of right nostril using your pinkie finger for 7 days (apply to the nostril part, not the middle part), then pinch your nose for 10 seconds, then stop  After that, use vaseline 3-4 times or nasal saline spray (over the counter) for about 4 weeks   Avoid blowing your nose

## 2024-02-15 NOTE — Progress Notes (Signed)
 Dear Dr. Duanne, Here is my assessment for our mutual patient, Kevin Paul. Thank you for allowing me the opportunity to care for your patient. Please do not hesitate to contact me should you have any other questions. Sincerely, Dr. Eldora Blanch  Otolaryngology Clinic Note  HISTORY:  Initial visit (01/2024): Discussed the use of AI scribe software for clinical note transcription with the patient, who gave verbal consent to proceed.  History of Present Illness Kevin Paul is a 48 year old male with hypertension and hyperlipidemia who presents with recurrent right-sided nosebleeds. He was referred by his primary care doctor for evaluation of recurrent nosebleeds.  Epistaxis began spontaneously about six weeks ago, primarily affecting the right side. Episodes are triggered by nose blowing and result in significant bleeding with large clots. No left-sided bleeding unless the right side is obstructed. He stopped taking baby aspirin  a week and a half ago, which he started two months prior. Despite discontinuation, he experienced a less severe but persistent nosebleed yesterday. Stops with pressure and afrin. He has used humidification with nasal sprays but still with persistent sx  Some nasal obstruction, prior nasal trauma. He denies frequent sinus infections but has seasonal allergies. Previously used [employer], discontinued six to eight months ago. He experiences persistent headaches described as pressure, attributed to stress. No facial pressure/pain/hyposmia/ A/P rhinorrhea  HTN: yes CKD/Liver dysfunction: no Anticoagulation/AP: prior ASA 81, stopped Trauma: no recent History of Sinusitis: no Nasal obstruction: yes, does report Nasal procedures: no Current nasal medication use: used to use flonase, but stopped  Seasonal allergy symptoms Allergy testing has not been done.  No previous sinonasal surgery.  GLP-1: no AP/AC: ASA 81  Tobacco: no  PMHx: HTN, HLD, GERD,  Headache, Adjustment disorder  RADIOGRAPHIC EVALUATION AND INDEPENDENT REVIEW OF OTHER RECORDS:: Dr. Aletha (02/05/2024): noted recurrent epistaxis - 6 weeks, 4-5 episodes; possible due to ASA use; Rx: ref to ENT, stop ASA, humidification with saline spray and humidifier; has tried flonase CBC and CMP 10/22/2023: BUN/Cr 13/1.13; AST/ALT 31/42; WBC 6.3, Eos 290; Plt 210; Hgb 16.3 Past Medical History:  Diagnosis Date   Cardiomegaly 08/24/2014   [facility]   COVID-19 virus infection 09/06/2019   COVID-19 virus infection 09/06/2019   Hematuria 08/24/2014   exercise induced- [facility]   HSV-2 infection    Hypertension    Pneumonia    10 years ago   PONV (postoperative nausea and vomiting)    Past Surgical History:  Procedure Laterality Date   ANTERIOR CRUCIATE LIGAMENT REPAIR  07/1994   KNEE ARTHROSCOPY Bilateral    Has had multiple knee surgeries in his Teens-20s   VENTRAL HERNIA REPAIR N/A 01/25/2020   Procedure: open primary supraumbilical ventral hernia repair;  Surgeon: Teresa Lonni HERO, MD;  Location: [facility];  Service: General;  Laterality: N/A;   WISDOM TOOTH EXTRACTION     Family History  Problem Relation Age of Onset   Hyperlipidemia Father    Hypertension Father    Heart disease Father 29       MI--stent   Heart attack Father    Heart attack Brother    Stroke Paternal Grandfather 15       Stroke   Colon polyps Neg Hx    Esophageal cancer Neg Hx    Inflammatory bowel disease Neg Hx    Liver disease Neg Hx    Pancreatic cancer Neg Hx    Rectal cancer Neg Hx    Stomach cancer Neg Hx  Social History   Tobacco Use   Smoking status: Never   Smokeless tobacco: Never  Substance Use Topics   Alcohol use: Yes    Alcohol/week: 4.0 standard drinks of alcohol    Types: 4 Cans of beer per week   No Known Allergies Current Outpatient Medications  Medication Sig Dispense Refill   acyclovir  (ZOVIRAX ) 400 MG tablet TAKE 1 TABLET (400 MG TOTAL) BY MOUTH 3  (THREE) TIMES DAILY. FOR 5 DAYS 15 tablet 5   amLODipine  (NORVASC ) 10 MG tablet Take 1 tablet (10 mg total) by mouth daily. 30 tablet 5   aspirin  EC 81 MG tablet Take 1 tablet (81 mg total) by mouth daily. Swallow whole.     ezetimibe  (ZETIA ) 10 MG tablet Take 1 tablet (10 mg total) by mouth daily. 30 tablet 2   hydrochlorothiazide  (HYDRODIURIL ) 25 MG tablet TAKE 1 TABLET (25 MG TOTAL) BY MOUTH DAILY. 30 tablet 11   losartan -hydrochlorothiazide  (HYZAAR) 50-12.5 MG tablet Take 1 tablet by mouth daily. 90 tablet 1   mometasone  (ELOCON ) 0.1 % cream Apply topically daily. 45 g 1   mupirocin  ointment (BACTROBAN ) 2 % Apply 1 Application topically 2 (two) times daily for 7 days. 22 g 0   rosuvastatin  (CRESTOR ) 20 MG tablet Take 1 tablet (20 mg total) by mouth daily. 30 tablet 2   sertraline  (ZOLOFT ) 25 MG tablet Take 1 tablet (25 mg total) by mouth daily. 90 tablet 1   tadalafil  (CIALIS ) 10 MG tablet TAKE ONE TABLET BY MOUTH DAILY AS NEEDED FOR ERECTILE DYSFUNCTION. 5 tablet 17   No current facility-administered medications for this visit.   BP 129/89 (BP Location: Right Arm, Patient Position: Sitting, Cuff Size: Large)   Pulse 78   Ht 5' 10 (1.778 m)   Wt 195 lb (88.5 kg)   SpO2 97%   BMI 27.98 kg/m   PHYSICAL EXAM:  BP 129/89 (BP Location: Right Arm, Patient Position: Sitting, Cuff Size: Large)   Pulse 78   Ht 5' 10 (1.778 m)   Wt 195 lb (88.5 kg)   SpO2 97%   BMI 27.98 kg/m    Salient findings:  CN II-XII intact Bilateral EAC clear and TM intact with well pneumatized middle ear spaces Nose: Anterior rhinoscopy reveals septal dev left, no septal prominent vessel.  Nasal endoscopy was indicated to better evaluate the nose and paranasal sinuses, given the patient's history and exam findings, and is detailed below. No lesions of oral cavity/oropharynx No obviously palpable neck masses/lymphadenopathy/thyromegaly No respiratory distress or stridor   PROCEDURE:  Prior to initiating  any procedures, risks/benefits/alternatives were explained to the patient and verbal consent obtained. Diagnostic Nasal Endoscopy PROCEDURE: Bilateral Rigid Nasal Endoscopy with endoscopic control of Right sided epistaxis (CPT [phone]) Pre-procedure diagnosis: Epistaxis Post-procedure diagnosis: same Indication: See pre-procedure diagnosis and physical exam above Complications: None apparent EBL: minimal mL Anesthesia: Lidocaine  4% and topical decongestant was topically sprayed in each nasal cavity  Description of Procedure:  Patient was identified as correct patient. Verbal consent obtained. Afrin/lidocaine  mix was sprayed into the nose in both nasal cavities. Subsequently, a headlight and speculum were used and prominent vessels were not noted over septum; small clot over right IT head (just posterior), but cannot visualize full extent using the headlight/speculum. Therefore, nasal endoscopy was necessary to evaluate and control the epistaxis.  A rigid 30 degree endoscope was utilized to evaluate the sinonasal cavities, mucosa, sinus ostia and turbinates and septum bilaterally. On the left, there were findings above but  otherwise no bleeding was noted. On the contralateral side, there were findings above but was no active bleeding. B/l MM and SE recess clear without purulence. No polyps in nasal cavity. We discussed options including humidification, v/s cautery and R/B/A and patient opted for cauterization. Given lack of complete visualization of the posterior portion of the vessel extent, decision was performed to perform cauterization after consent. Using the endoscope, the areas of bleeding over right inferior turbinate head and just posterior to it were visualized and then spot cauterized using silver nitrate cautery.  No bleeding was seen. Mupirocin  ointment was applied to both nares. Patient tolerated the procedure well.   ASSESSMENT:  48 y.o. with:  1. Epistaxis    Right epistaxis, likely  due to prominent right IT vessel. Persists despite medical management. Discussed options and he opted for cauterization.   - Performed silver nitrate cauterization of the inferior turbinate. - Instructed to use mupirocin  ointment inside the nose twice daily for one week. - Instructed to use saline spray/vaseline twice daily after the first week. - Advised to avoid nose blowing for 10 days. - Instructed to keep a diary of any post-cauterization bleeding. - Advised to restart aspirin  after 8 weeks if no further issues. - d/w pt f/u, he opted PRN, should sx continue  See below regarding exact medications prescribed this encounter including dosages and route: Meds ordered this encounter  Medications   mupirocin  ointment (BACTROBAN ) 2 %    Sig: Apply 1 Application topically 2 (two) times daily for 7 days.    Dispense:  22 g    Refill:  0     Thank you for allowing me the opportunity to care for your patient. Please do not hesitate to contact me should you have any other questions.  Sincerely, Eldora Blanch, MD Otolaryngologist (ENT), [facility] Phone: [phone] Fax: [phone]  MDM:  Level 4: [phone] Complexity/Problems addressed: low Data complexity: mod - independent interpretation of note, labs - Morbidity: mod  - Drug prescribed or managed: y  02/16/2024, 9:43 PM

## 2024-02-16 ENCOUNTER — Encounter (INDEPENDENT_AMBULATORY_CARE_PROVIDER_SITE_OTHER): Payer: Self-pay | Admitting: Otolaryngology

## 2024-02-27 ENCOUNTER — Other Ambulatory Visit: Payer: Self-pay | Admitting: Family Medicine

## 2024-02-27 DIAGNOSIS — E7800 Pure hypercholesterolemia, unspecified: Secondary | ICD-10-CM

## 2024-02-27 DIAGNOSIS — Z8249 Family history of ischemic heart disease and other diseases of the circulatory system: Secondary | ICD-10-CM

## 2024-03-01 ENCOUNTER — Other Ambulatory Visit: Payer: Self-pay | Admitting: Family Medicine

## 2024-03-08 NOTE — Progress Notes (Unsigned)
 Cardiology Office Note Date:  03/10/2024  ID:  BOE DEANS, DOB 1976/03/23, MRN [id number] PCP:  Duanne Butler DASEN, MD  Cardiologist:  Joelle VEAR Ren Donley, MD  Chief Complaint  Patient presents with   Coronary Artery Disease      Problems Elevated CAC 9/25- 261 (97th percentile- 233 in LAD) M: AE10, ASA81, EE10, HTZ 25, LN-HTZ 50-12.5, RN20 L: LDL 83 7/25; goal < 70  Visits  12/25: HA1C, TTE, Lpa, LP, coronary CTA    History of Present Illness:  Discussed the use of AI scribe software for clinical note transcription with the patient, who gave verbal consent to proceed.      Kevin Paul is a 48 year old male who presents for a cardiology consultation to evaluate his cardiac risk and management options. He was referred by his primary care physician for further cardiac evaluation due to family history and elevated calcium  score. He has a strong family history of cardiovascular disease, with his father dying of stroke, his grandfather of heart attack, and his older brother recently having a heart attack at age 58. A recent coronary calcium  scan showed a score of 261 (233 in LAD), after which he was started on Zetia  about two months ago. 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 He exercises 2 to 3 times weekly with moderate weight training and cardio. He attempts a relatively healthy diet with vegetables, salads, and lean proteins but also eats red meat and burgers. He smokes only an occasional cigar about once a year and has no history of regular smoking. He has elevated blood pressure and significant work-related stress. He recently started low-dose Zoloft , which he feels has improved his stress and possibly his blood pressure.      ROS: Please see the history of present illness. All other systems are reviewed and negative.   Past Medical History:  Diagnosis Date   Cardiomegaly 08/24/2014   [facility]    COVID-19 virus infection 09/06/2019   COVID-19 virus infection 09/06/2019   Hematuria 08/24/2014   exercise induced- [facility]   HSV-2 infection    Hypertension    Pneumonia    10 years ago   PONV (postoperative nausea and vomiting)     Past Surgical History:  Procedure Laterality Date   ANTERIOR CRUCIATE LIGAMENT REPAIR  07/1994   KNEE ARTHROSCOPY Bilateral    Has had multiple knee surgeries in his Teens-20s   VENTRAL HERNIA REPAIR N/A 01/25/2020   Procedure: open primary supraumbilical ventral hernia repair;  Surgeon: Teresa Lonni HERO, MD;  Location: [facility];  Service: General;  Laterality: N/A;   WISDOM TOOTH EXTRACTION      Current Outpatient Medications  Medication Sig Dispense Refill   acyclovir  (ZOVIRAX ) 400 MG tablet TAKE 1 TABLET (400 MG TOTAL) BY MOUTH 3 (THREE) TIMES DAILY. FOR 5 DAYS 15 tablet 5   amLODipine  (NORVASC ) 10 MG tablet TAKE 1 TABLET BY MOUTH EVERY DAY 30 tablet 5   aspirin  EC 81 MG tablet Take 1 tablet (81 mg total) by mouth daily. Swallow whole.     ezetimibe  (ZETIA ) 10 MG tablet TAKE 1 TABLET BY MOUTH EVERY DAY 30 tablet 2   losartan -hydrochlorothiazide  (HYZAAR) 50-12.5 MG tablet Take 1 tablet by mouth daily. 90 tablet 1   mometasone  (ELOCON ) 0.1 % cream Apply topically daily. 45 g 1   rosuvastatin  (CRESTOR ) 20 MG tablet Take 1 tablet (20 mg  total) by mouth daily. 30 tablet 2   sertraline  (ZOLOFT ) 25 MG tablet Take 1 tablet (25 mg total) by mouth daily. 90 tablet 1   tadalafil  (CIALIS ) 10 MG tablet TAKE ONE TABLET BY MOUTH DAILY AS NEEDED FOR ERECTILE DYSFUNCTION. 5 tablet 17   hydrochlorothiazide  (HYDRODIURIL ) 25 MG tablet TAKE 1 TABLET (25 MG TOTAL) BY MOUTH DAILY. (Patient not taking: Reported on 03/10/2024) 30 tablet 11   No current facility-administered medications for this visit.    Allergies:   Patient has no known allergies.   Social History:  see above  Family History:  see above  PHYSICAL EXAM: VS:  BP 130/88 (BP Location: Right Arm,  Patient Position: Sitting, Cuff Size: Large)   Pulse 73   Ht 5' 10 (1.778 m)   Wt 202 lb (91.6 kg)   SpO2 99%   BMI 28.98 kg/m  , BMI Body mass index is 28.98 kg/m. GEN: Well nourished, well developed, in no acute distress HEENT: normal Neck: no JVD, carotid bruits, or masses Cardiac: RRR; no murmurs, rubs, or gallops,no edema  Respiratory:  CTAB bilaterally, normal work of breathing GI: soft, nontender, nondistended, + BS Extremities: No LE edema Skin: warm and dry, no rash Neuro:  Strength and sensation are intact  EKG: NSR  Recent Labs: Reviewed  Studies: Reviewed  ASSESSMENT AND PLAN: Kevin Paul is a 48 y.o. male who presents for new visit.     Coronary artery disease risk assessment with elevated coronary artery calcium   No symptoms. Elevated CAC on CT. Family history of premature cardiovascular events.  - Ordered echocardiogram to assess cardiac function. - Ordered coronary CTA to evaluate for blockages in the LAD. - Ordered lipoprotein A test to assess genetic risk for blockages. - Scheduled follow-up in three months to discuss results and management.  Hyperlipidemia Managed with statin and Zetia , initiated two months ago following elevated calcium  score. Emphasis on lifestyle modifications. - Continue statin and Zetia  for cholesterol management. - Ordered lipid panel to reassess cholesterol levels in three months.  Essential hypertension Previously elevated blood pressure, potentially related to stress. Currently on medication and low-dose Zoloft . Blood pressure improving. - Continue current antihypertensive regimen. - Monitor blood pressure and reassess in three months to check renin aldo      Signed, Joelle VEAR Ren Donley, MD  03/10/2024 9:19 AM    [facility]

## 2024-03-10 ENCOUNTER — Ambulatory Visit

## 2024-03-10 VITALS — BP 130/88 | HR 73 | Ht 70.0 in | Wt 202.0 lb

## 2024-03-10 DIAGNOSIS — I10 Essential (primary) hypertension: Secondary | ICD-10-CM

## 2024-03-10 DIAGNOSIS — Z8249 Family history of ischemic heart disease and other diseases of the circulatory system: Secondary | ICD-10-CM

## 2024-03-10 DIAGNOSIS — R931 Abnormal findings on diagnostic imaging of heart and coronary circulation: Secondary | ICD-10-CM

## 2024-03-10 DIAGNOSIS — E785 Hyperlipidemia, unspecified: Secondary | ICD-10-CM | POA: Diagnosis not present

## 2024-03-10 LAB — HEMOGLOBIN A1C
Est. average glucose Bld gHb Est-mCnc: 126 mg/dL
Hgb A1c MFr Bld: 6 % — ABNORMAL HIGH (ref 4.8–5.6)

## 2024-03-10 LAB — LIPID PANEL
Chol/HDL Ratio: 2.7 ratio (ref 0.0–5.0)
Cholesterol, Total: 151 mg/dL (ref 100–199)
HDL: 56 mg/dL (ref >39)
LDL Chol Calc (NIH): 80 mg/dL (ref 0–99)
Triglycerides: 76 mg/dL (ref 0–149)
VLDL Cholesterol Cal: 15 mg/dL (ref 5–40)

## 2024-03-10 MED ORDER — METOPROLOL TARTRATE 100 MG PO TABS: ORAL_TABLET | ORAL | 0 refills | Status: AC

## 2024-03-10 NOTE — Patient Instructions (Addendum)
 Testing/Procedures: [address] Your physician has requested that you have an echocardiogram. Echocardiography is a painless test that uses sound waves to create images of your heart. It provides your doctor with information about the size and shape of your heart and how well your hearts chambers and valves are working. This procedure takes approximately one hour. There are no restrictions for this procedure. Please do NOT wear cologne, perfume, aftershave, or lotions (deodorant is allowed). Please arrive 15 minutes prior to your appointment time.  Please note: We ask at that you not bring children with you during ultrasound (echo/ vascular) testing. Due to room size and safety concerns, children are not allowed in the ultrasound rooms during exams. Our front office staff cannot provide observation of children in our lobby area while testing is being conducted. An adult accompanying a patient to their appointment will only be allowed in the ultrasound room at the discretion of the ultrasound technician under special circumstances. We apologize for any inconvenience.   Follow-Up: At [facility], you and your health needs are our priority.  As part of our continuing mission to provide you with exceptional heart care, our providers are all part of one team.  This team includes your primary Cardiologist (physician) and Advanced Practice Providers or APPs (Physician Assistants and Nurse Practitioners) who all work together to provide you with the care you need, when you need it.  Your next appointment:   3 month(s)  Provider:   DR REN   We recommend signing up for the patient portal called MyChart.  Sign up information is provided on this After Visit Summary.  MyChart is used to connect with patients for Virtual Visits (Telemedicine).  Patients are able to view lab/test results, encounter notes, upcoming appointments, etc.  Non-urgent messages can be sent to your provider as  well.   To learn more about what you can do with MyChart, go to [email or web address].   Other Instructions   Your cardiac CT will be scheduled at  [facility] [address]  [place], [place] [address]   If scheduled at the Heart and Vascular Tower at [address], please enter the parking lot using the [address] entrance and use the FREE valet service at the patient drop-off area. Enter the building and check-in with registration on the main floor.   Please follow these instructions carefully (unless otherwise directed):  An IV will be required for this test and Nitroglycerin will be given.   DO NOT TAKE CIALIS  THE DAY OF THE PROCEDURE  On the Night Before the Test: Be sure to Drink plenty of water. Do not consume any caffeinated/decaffeinated beverages or chocolate 12 hours prior to your test. Do not take any antihistamines 12 hours prior to your test.   On the Day of the Test: Drink plenty of water until 1 hour prior to the test. Do not eat any food 1 hour prior to test. You may take your regular medications prior to the test.  Take metoprolol  (Lopressor ) two hours prior to test 100 MG Patients who wear a continuous glucose monitor MUST remove the device prior to scanning.  After the Test: Drink plenty of water. After receiving IV contrast, you may experience a mild flushed feeling. This is normal. On occasion, you may experience a mild rash up to 24 hours after the test. This is not dangerous. If this occurs, you can take Benadryl  25 mg, Zyrtec, Claritin, or Allegra and increase your  fluid intake. (Patients taking Tikosyn should avoid Benadryl , and may take Zyrtec, Claritin, or Allegra) If you experience trouble breathing, this can be serious. If it is severe call 911 IMMEDIATELY. If it is mild, please call our office.  We will call to schedule your test 2-4 weeks out understanding that some insurance companies will need an  authorization prior to the service being performed.   For more information and frequently asked questions, please visit our website : [email or web address]  For non-scheduling related questions, please contact the cardiac imaging nurse navigator should you have any questions/concerns: Cardiac Imaging Nurse Navigators Direct Office Dial: [phone]   For scheduling needs, including cancellations and rescheduling, please call Brittany, [phone].

## 2024-03-11 ENCOUNTER — Ambulatory Visit: Payer: Self-pay

## 2024-03-11 LAB — LIPOPROTEIN A (LPA): Lipoprotein (a): 29.2 nmol/L

## 2024-03-27 ENCOUNTER — Other Ambulatory Visit: Payer: Self-pay | Admitting: Family Medicine

## 2024-03-27 DIAGNOSIS — E7800 Pure hypercholesterolemia, unspecified: Secondary | ICD-10-CM

## 2024-03-27 DIAGNOSIS — Z8249 Family history of ischemic heart disease and other diseases of the circulatory system: Secondary | ICD-10-CM

## 2024-03-27 DIAGNOSIS — R931 Abnormal findings on diagnostic imaging of heart and coronary circulation: Secondary | ICD-10-CM

## 2024-04-18 ENCOUNTER — Encounter (HOSPITAL_COMMUNITY): Payer: Self-pay

## 2024-04-20 ENCOUNTER — Ambulatory Visit (HOSPITAL_COMMUNITY): Admission: RE | Admit: 2024-04-20 | Discharge: 2024-04-20 | Disposition: A | Source: Ambulatory Visit

## 2024-04-20 ENCOUNTER — Ambulatory Visit (HOSPITAL_COMMUNITY)
Admission: RE | Admit: 2024-04-20 | Discharge: 2024-04-20 | Disposition: A | Discharge status: Home / Self Care | Source: Ambulatory Visit | Attending: Cardiovascular Disease | Admitting: Cardiovascular Disease

## 2024-04-20 DIAGNOSIS — I10 Essential (primary) hypertension: Secondary | ICD-10-CM

## 2024-04-20 DIAGNOSIS — E785 Hyperlipidemia, unspecified: Secondary | ICD-10-CM | POA: Insufficient documentation

## 2024-04-20 DIAGNOSIS — R931 Abnormal findings on diagnostic imaging of heart and coronary circulation: Secondary | ICD-10-CM | POA: Insufficient documentation

## 2024-04-20 DIAGNOSIS — I2510 Atherosclerotic heart disease of native coronary artery without angina pectoris: Secondary | ICD-10-CM

## 2024-04-20 DIAGNOSIS — Z8249 Family history of ischemic heart disease and other diseases of the circulatory system: Secondary | ICD-10-CM

## 2024-04-20 DIAGNOSIS — R9430 Abnormal result of cardiovascular function study, unspecified: Secondary | ICD-10-CM | POA: Diagnosis not present

## 2024-04-20 LAB — ECHOCARDIOGRAM COMPLETE
Area-P 1/2: 4.18 cm2
S' Lateral: 3.6 cm

## 2024-04-20 MED ORDER — NITROGLYCERIN 0.4 MG SL SUBL
0.8000 mg | SUBLINGUAL_TABLET | Freq: Once | SUBLINGUAL | Status: AC
  Administered 2024-04-20: 0.8 mg via SUBLINGUAL

## 2024-04-20 MED ORDER — IOHEXOL 350 MG/ML SOLN
100.0000 mL | Freq: Once | INTRAVENOUS | Status: AC | PRN
  Administered 2024-04-20: 100 mL via INTRAVENOUS

## 2024-04-25 NOTE — Telephone Encounter (Signed)
"   I called [name] to discuss the result of his coronary CTA and 2D echocardiogram.  I explained that his heart function was normal and that he had nonobstructive CAD.  He continues to be asymptomatic and adherent to his aspirin  and statin.  I explained that he should have his daughter check her LPA and we will do genetic testing during the next visit.`  Joelle DEL. Ren Ny, MD [facility]  "

## 2024-06-16 ENCOUNTER — Ambulatory Visit
